# Patient Record
Sex: Female | Born: 1955
Health system: Southern US, Community
[De-identification: ages and names within clinical notes are randomized; demographics above are authoritative.]

## PROBLEM LIST (undated history)

## (undated) DIAGNOSIS — M810 Age-related osteoporosis without current pathological fracture: Secondary | ICD-10-CM

## (undated) DIAGNOSIS — M8580 Other specified disorders of bone density and structure, unspecified site: Secondary | ICD-10-CM

## (undated) DIAGNOSIS — R0600 Dyspnea, unspecified: Secondary | ICD-10-CM

## (undated) DIAGNOSIS — R519 Headache, unspecified: Secondary | ICD-10-CM

## (undated) DIAGNOSIS — F419 Anxiety disorder, unspecified: Secondary | ICD-10-CM

## (undated) DIAGNOSIS — R51 Headache: Secondary | ICD-10-CM

## (undated) DIAGNOSIS — M1990 Unspecified osteoarthritis, unspecified site: Secondary | ICD-10-CM

## (undated) DIAGNOSIS — R011 Cardiac murmur, unspecified: Secondary | ICD-10-CM

## (undated) DIAGNOSIS — I340 Nonrheumatic mitral (valve) insufficiency: Secondary | ICD-10-CM

## (undated) DIAGNOSIS — IMO0001 Reserved for inherently not codable concepts without codable children: Secondary | ICD-10-CM

## (undated) DIAGNOSIS — B029 Zoster without complications: Secondary | ICD-10-CM

## (undated) DIAGNOSIS — T7840XA Allergy, unspecified, initial encounter: Secondary | ICD-10-CM

## (undated) HISTORY — DX: Other specified disorders of bone density and structure, unspecified site: M85.80

## (undated) HISTORY — DX: Age-related osteoporosis without current pathological fracture: M81.0

## (undated) HISTORY — DX: Allergy, unspecified, initial encounter: T78.40XA

## (undated) HISTORY — PX: EYE SURGERY: SHX253

## (undated) HISTORY — PX: SPINE SURGERY: SHX786

## (undated) HISTORY — DX: Zoster without complications: B02.9

## (undated) HISTORY — DX: Nonrheumatic mitral (valve) insufficiency: I34.0

## (undated) HISTORY — PX: JOINT REPLACEMENT: SHX530

---

## 1999-05-31 ENCOUNTER — Other Ambulatory Visit: Admission: RE | Admit: 1999-05-31 | Discharge: 1999-05-31 | Payer: Self-pay | Admitting: *Deleted

## 1999-05-31 ENCOUNTER — Encounter (INDEPENDENT_AMBULATORY_CARE_PROVIDER_SITE_OTHER): Payer: Self-pay

## 1999-12-22 ENCOUNTER — Ambulatory Visit (HOSPITAL_COMMUNITY): Admission: RE | Admit: 1999-12-22 | Discharge: 1999-12-22 | Payer: Self-pay | Admitting: Internal Medicine

## 2000-07-03 ENCOUNTER — Other Ambulatory Visit: Admission: RE | Admit: 2000-07-03 | Discharge: 2000-07-03 | Payer: Self-pay | Admitting: *Deleted

## 2001-06-13 ENCOUNTER — Emergency Department (HOSPITAL_COMMUNITY): Admission: EM | Admit: 2001-06-13 | Discharge: 2001-06-14 | Payer: Self-pay | Admitting: Emergency Medicine

## 2001-06-13 ENCOUNTER — Encounter: Payer: Self-pay | Admitting: Emergency Medicine

## 2001-07-25 ENCOUNTER — Other Ambulatory Visit: Admission: RE | Admit: 2001-07-25 | Discharge: 2001-07-25 | Payer: Self-pay | Admitting: *Deleted

## 2002-08-07 ENCOUNTER — Encounter: Payer: Self-pay | Admitting: *Deleted

## 2002-08-07 ENCOUNTER — Encounter: Admission: RE | Admit: 2002-08-07 | Discharge: 2002-08-07 | Payer: Self-pay | Admitting: *Deleted

## 2002-09-18 ENCOUNTER — Other Ambulatory Visit: Admission: RE | Admit: 2002-09-18 | Discharge: 2002-09-18 | Payer: Self-pay | Admitting: *Deleted

## 2003-07-19 ENCOUNTER — Encounter: Admission: RE | Admit: 2003-07-19 | Discharge: 2003-07-19 | Payer: Self-pay | Admitting: Obstetrics and Gynecology

## 2003-11-10 ENCOUNTER — Other Ambulatory Visit: Admission: RE | Admit: 2003-11-10 | Discharge: 2003-11-10 | Payer: Self-pay | Admitting: Obstetrics and Gynecology

## 2004-12-11 ENCOUNTER — Ambulatory Visit: Payer: Self-pay | Admitting: Internal Medicine

## 2005-01-15 ENCOUNTER — Other Ambulatory Visit: Admission: RE | Admit: 2005-01-15 | Discharge: 2005-01-15 | Payer: Self-pay | Admitting: Obstetrics and Gynecology

## 2005-12-21 ENCOUNTER — Ambulatory Visit: Payer: Self-pay | Admitting: Internal Medicine

## 2006-12-11 DIAGNOSIS — J302 Other seasonal allergic rhinitis: Secondary | ICD-10-CM | POA: Insufficient documentation

## 2006-12-11 DIAGNOSIS — J4540 Moderate persistent asthma, uncomplicated: Secondary | ICD-10-CM | POA: Insufficient documentation

## 2006-12-11 DIAGNOSIS — J309 Allergic rhinitis, unspecified: Secondary | ICD-10-CM

## 2006-12-12 ENCOUNTER — Encounter: Payer: Self-pay | Admitting: Internal Medicine

## 2006-12-12 ENCOUNTER — Ambulatory Visit: Payer: Self-pay | Admitting: Internal Medicine

## 2006-12-17 ENCOUNTER — Encounter: Payer: Self-pay | Admitting: Internal Medicine

## 2006-12-17 ENCOUNTER — Ambulatory Visit: Payer: Self-pay

## 2006-12-17 DIAGNOSIS — I340 Nonrheumatic mitral (valve) insufficiency: Secondary | ICD-10-CM

## 2006-12-17 HISTORY — DX: Nonrheumatic mitral (valve) insufficiency: I34.0

## 2006-12-27 ENCOUNTER — Telehealth: Payer: Self-pay | Admitting: Internal Medicine

## 2007-02-09 LAB — CONVERTED CEMR LAB: Pap Smear: NORMAL

## 2007-05-06 ENCOUNTER — Telehealth (INDEPENDENT_AMBULATORY_CARE_PROVIDER_SITE_OTHER): Payer: Self-pay | Admitting: *Deleted

## 2007-07-14 ENCOUNTER — Ambulatory Visit: Payer: Self-pay | Admitting: Internal Medicine

## 2007-07-14 LAB — CONVERTED CEMR LAB
ALT: 18 units/L (ref 0–35)
AST: 22 units/L (ref 0–37)
Albumin: 3.9 g/dL (ref 3.5–5.2)
Alkaline Phosphatase: 58 units/L (ref 39–117)
BUN: 18 mg/dL (ref 6–23)
Basophils Absolute: 0 10*3/uL (ref 0.0–0.1)
Basophils Relative: 0.6 % (ref 0.0–1.0)
Bilirubin Urine: NEGATIVE
Bilirubin, Direct: 0.1 mg/dL (ref 0.0–0.3)
CO2: 27 meq/L (ref 19–32)
Calcium: 9.1 mg/dL (ref 8.4–10.5)
Chloride: 107 meq/L (ref 96–112)
Cholesterol: 172 mg/dL (ref 0–200)
Creatinine, Ser: 0.9 mg/dL (ref 0.4–1.2)
Eosinophils Absolute: 0.2 10*3/uL (ref 0.0–0.7)
Eosinophils Relative: 3.6 % (ref 0.0–5.0)
GFR calc Af Amer: 85 mL/min
GFR calc non Af Amer: 70 mL/min
Glucose, Bld: 83 mg/dL (ref 70–99)
Glucose, Urine, Semiquant: NEGATIVE
HCT: 39.2 % (ref 36.0–46.0)
HDL: 91.8 mg/dL (ref >39.0)
Hemoglobin: 13.1 g/dL (ref 12.0–15.0)
Ketones, urine, test strip: NEGATIVE
LDL Cholesterol: 71 mg/dL (ref 0–99)
Lymphocytes Relative: 40.8 % (ref 12.0–46.0)
MCHC: 33.4 g/dL (ref 30.0–36.0)
MCV: 79.7 fL (ref 78.0–100.0)
Monocytes Absolute: 0.5 10*3/uL (ref 0.1–1.0)
Monocytes Relative: 9.8 % (ref 3.0–12.0)
Neutro Abs: 2.3 10*3/uL (ref 1.4–7.7)
Neutrophils Relative %: 45.2 % (ref 43.0–77.0)
Nitrite: NEGATIVE
Platelets: 220 10*3/uL (ref 150–400)
Potassium: 4 meq/L (ref 3.5–5.1)
RBC: 4.92 M/uL (ref 3.87–5.11)
RDW: 12.8 % (ref 11.5–14.6)
Sodium: 142 meq/L (ref 135–145)
Specific Gravity, Urine: >=1.03
TSH: 1.07 microintl units/mL (ref 0.35–5.50)
Total Bilirubin: 0.7 mg/dL (ref 0.3–1.2)
Total CHOL/HDL Ratio: 1.9
Total Protein: 6.4 g/dL (ref 6.0–8.3)
Triglycerides: 45 mg/dL (ref 0–149)
Urobilinogen, UA: 0.2
VLDL: 9 mg/dL (ref 0–40)
WBC Urine, dipstick: NEGATIVE
WBC: 5.1 10*3/uL (ref 4.5–10.5)
pH: 5.5

## 2007-07-21 ENCOUNTER — Ambulatory Visit: Payer: Self-pay | Admitting: Internal Medicine

## 2007-08-18 ENCOUNTER — Ambulatory Visit: Payer: Self-pay | Admitting: Gastroenterology

## 2007-08-18 ENCOUNTER — Encounter (INDEPENDENT_AMBULATORY_CARE_PROVIDER_SITE_OTHER): Payer: Self-pay | Admitting: *Deleted

## 2007-09-01 ENCOUNTER — Ambulatory Visit: Payer: Self-pay | Admitting: Gastroenterology

## 2007-09-01 LAB — HM COLONOSCOPY

## 2007-10-16 ENCOUNTER — Ambulatory Visit: Payer: Self-pay | Admitting: Internal Medicine

## 2007-12-12 ENCOUNTER — Ambulatory Visit: Payer: Self-pay | Admitting: Internal Medicine

## 2008-09-15 ENCOUNTER — Encounter: Payer: Self-pay | Admitting: Internal Medicine

## 2008-12-30 ENCOUNTER — Ambulatory Visit: Payer: Self-pay | Admitting: Internal Medicine

## 2009-05-02 ENCOUNTER — Ambulatory Visit: Payer: Self-pay | Admitting: Family Medicine

## 2009-06-24 ENCOUNTER — Encounter: Payer: Self-pay | Admitting: Internal Medicine

## 2009-12-23 ENCOUNTER — Encounter: Payer: Self-pay | Admitting: Internal Medicine

## 2009-12-23 ENCOUNTER — Ambulatory Visit: Payer: Self-pay | Admitting: Internal Medicine

## 2009-12-26 ENCOUNTER — Encounter: Payer: Self-pay | Admitting: Internal Medicine

## 2009-12-27 ENCOUNTER — Telehealth (INDEPENDENT_AMBULATORY_CARE_PROVIDER_SITE_OTHER): Payer: Self-pay | Admitting: *Deleted

## 2009-12-27 LAB — CONVERTED CEMR LAB
Albumin: 4.2 g/dL (ref 3.5–5.2)
Total Protein: 7.1 g/dL (ref 6.0–8.3)

## 2010-01-08 DIAGNOSIS — B029 Zoster without complications: Secondary | ICD-10-CM

## 2010-01-08 HISTORY — DX: Zoster without complications: B02.9

## 2010-02-07 NOTE — Letter (Signed)
Summary: [facility]  [facility]   Imported By: Maryln Gottron 07/14/2009 13:21:48  _____________________________________________________________________  External Attachment:    Type:   Image     Comment:   External Document

## 2010-02-07 NOTE — Assessment & Plan Note (Signed)
Summary: st/njr   Vital Signs:  Patient profile:   55 year old female Weight:      144 pounds Temp:     98.2 degrees F oral BP sitting:   110 / 72  (left arm) Cuff size:   regular  Vitals Entered By: Kern Reap CMA Duncan Dull) (May 02, 2009 12:05 PM) CC: sore throat   CC:  sore throat.  History of Present Illness: Jessica Richards is a 55 year old, married female, nonsmoker, who comes in today with a 3-day history of a sore throat.  She has no fever, earache, cough, etc..  Her husband had a sore throat last week.  She is not been any contact with children who had strep  Allergies: No Known Drug Allergies  Past History:  Past medical, surgical, family and social histories (including risk factors) reviewed for relevance to current acute and chronic problems.  Past Medical History: Reviewed history from 12/11/2006 and no changes required. Allergic Rhinitis Asthma  Past Surgical History: Reviewed history from 07/21/2007 and no changes required. Denies surgical history  Family History: Reviewed history from 07/21/2007 and no changes required. mother-htn, smoker father- CAD (started in 82s) Family History of CAD Female 1st degree relative <50-father  Social History: Reviewed history from 12/12/2007 and no changes required. Occupation: Community education officer  Review of Systems      See HPI  Physical Exam  General:  Well-developed,well-nourished,in no acute distress; alert,appropriate and cooperative throughout examination Head:  Normocephalic and atraumatic without obvious abnormalities. No apparent alopecia or balding. Eyes:  No corneal or conjunctival inflammation noted. EOMI. Perrla. Funduscopic exam benign, without hemorrhages, exudates or papilledema. Vision grossly normal. Ears:  External ear exam shows no significant lesions or deformities.  Otoscopic examination reveals clear canals, tympanic membranes are intact bilaterally without bulging, retraction, inflammation or discharge.  Hearing is grossly normal bilaterally. Nose:  External nasal examination shows no deformity or inflammation. Nasal mucosa are pink and moist without lesions or exudates. Mouth:  Oral mucosa and oropharynx without lesions or exudates.  Teeth in good repair. Neck:  No deformities, masses, or tenderness noted.   Impression & Recommendations:  Problem # 1:  SORE THROAT (ICD-462) Assessment New  Orders: Rapid Strep (30865)  Complete Medication List: 1)  Theophylline Cr 200 Mg Tb12 (Theophylline) .... Take 1 by mouth three times a day 2)  Spiriva Handihaler 18 Mcg Caps (Tiotropium bromide monohydrate) .... Inhale 1 capsule once daily 3)  Proair Hfa 108 (90 Base) Mcg/act Aers (Albuterol sulfate) .... As needed 4)  Albuterol Sulfate (2.5 Mg/51ml) 0.083% Nebu (Albuterol sulfate) .Marland Kitchen.. 1 four times a day 5)  Prempro 0.625-2.5 Mg Tabs (Conj estrog-medroxyprogest ace) .... Take 1 by mouth once daily 6)  Serevent Diskus 50 Mcg/dose Aepb (Salmeterol xinafoate) .Marland Kitchen.. 1 puff twice daily 7)  Pulmicort Flexhaler 90 Mcg/act Aepb (Budesonide) .... 2 puffs and rinse two times a day 8)  Aerochamber Spacer  .... Use on your metered inhaler 9)  Hydromet 5-1.5 Mg/65ml Syrp (Hydrocodone-homatropine) .Marland Kitchen.. 1 or 2 tsps at bedtime prn  Patient Instructions: 1)  Tylenol, aspirin, or Motrin as needed for the sore throat pain.  You may take one or 2 teaspoons of Hydromet at bedtime as needed for severe pain Prescriptions: HYDROMET 5-1.5 MG/5ML SYRP (HYDROCODONE-HOMATROPINE) 1 or 2 tsps at bedtime prn  #4oz x 1   Entered and Authorized by:   Roderick Pee MD   Signed by:   Roderick Pee MD on 05/02/2009   Method used:   Print then  Give to Patient   RxID:   [id number]

## 2010-02-09 NOTE — Assessment & Plan Note (Signed)
Summary: rov 1 yr- pt requested this date ///kp   CC:  yearly follow up visit-asthma and allergies..  History of Present Illness:  12/12/07- Asthma, allergic rhinitis She felt b etter with Serevent/ pulmicort than with Symbicort. Does like Spiriva. Bad flu syndrome in October, treated by Dr Cato Mulligan. Stil some residual dry cough but not wheezing. denies reflux. has 2 outdoors cats. Wears mask with close contact, and very sensitive to strong odors.  December 30, 2008- Asthma, allergic rhinitis We switched her last year from serevent/ Pulmicort to Symbicort. She says with this change she has been having more mucus. We compared these to [facility]. Has had flu vax. Not running regularly at this time.  December 23, 2009-  Asthma, allergic rhinitis Nurse-CC: yearly follow up visit-asthma and allergies. Not running now and evaluated for bursitis in legs. Asthma has been worse this year. More chest tightness and dyspnea with wheeze, but not overt "attacks". No phlegm. Gets tach palpitation with increased use of her nebulizer. Denies reflux or nasal/ sinus. No need for prednisone or ER, but having difficulty holding notes in choir.     Asthma History    Initial Asthma Severity Rating:    Age range: 12+ years    Symptoms: >2 days/week; not daily    Nighttime Awakenings: 0-2/month    Interferes w/ normal activity: minor limitations    SABA use (not for EIB): >2 days/week but not >1X/day    Asthma Severity Assessment: Mild Persistent   Current Medications (verified): 1)  Theophylline Cr 200 Mg  Tb12 (Theophylline) .... Take 1 By Mouth Three Times A Day 2)  Spiriva Handihaler 18 Mcg  Caps (Tiotropium Bromide Monohydrate) .... Inhale 1 Capsule Once Daily 3)  Proair Hfa 108 (90 Base) Mcg/act Aers (Albuterol Sulfate) .... As Needed 4)  Albuterol Sulfate (2.5 Mg/31ml) 0.083% Nebu (Albuterol Sulfate) .Marland Kitchen.. 1 Four Times A Day 5)  Prempro 0.625-2.5 Mg  Tabs (Conj Estrog-Medroxyprogest Ace) .... Take 1 By  Mouth Once Daily 6)  Serevent Diskus 50 Mcg/dose Aepb (Salmeterol Xinafoate) .Marland Kitchen.. 1 Puff Twice Daily 7)  Pulmicort Flexhaler 90 Mcg/act Aepb (Budesonide) .... 2 Puffs and Rinse Two Times A Day 8)  Aerochamber Spacer .... Use On Your Metered Inhaler  Allergies (verified): No Known Drug Allergies  Past History:  Past Medical History: Last updated: 12/11/2006 Allergic Rhinitis Asthma  Past Surgical History: Last updated: 07/21/2007 Denies surgical history  Family History: Last updated: 07/21/2007 mother-htn, smoker father- CAD (started in 71s) Family History of CAD Female 1st degree relative <50-father  Social History: Last updated: 12/12/2007 Occupation: [employer]  Risk Factors: Alcohol Use: <1 (07/21/2007)  Risk Factors: Smoking Status: quit (12/11/2006)  Review of Systems       The patient complains of shortness of breath with activity and joint stiffness or pain.  The patient denies shortness of breath at rest, productive cough, non-productive cough, coughing up blood, chest pain, irregular heartbeats, acid heartburn, indigestion, loss of appetite, weight change, abdominal pain, difficulty swallowing, sore throat, tooth/dental problems, headaches, nasal congestion/difficulty breathing through nose, sneezing, itching, anxiety, hand/feet swelling, rash, change in color of mucus, and fever.    Vital Signs:  Patient profile:   55 year old female Height:      62.5 inches Weight:      144.25 pounds BMI:     26.06 O2 Sat:      96 % on Room air Pulse rate:   58 / minute BP sitting:   116 / 78  (left arm)  Cuff size:   regular  Vitals Entered By: Reynaldo Minium CMA (December 23, 2009 9:14 AM)  O2 Flow:  Room air  Physical Exam  Additional Exam:  General: A/Ox3; pleasant and cooperative, NAD, trim SKIN: no rash, lesions NODES: no lymphadenopathy HEENT: [place]/AT, EOM- WNL, Conjuctivae- clear, PERRLA, TM-WNL, Nose- clear, Throat- clear and wnl NECK: Supple w/ fair ROM, JVD-  none, normal carotid impulses w/o bruits Thyroid- CHEST: bilateral expiratory wheeze, umnlabored HEART: RRR, no m/g/r heard ABDOMEN: Soft and nl;  ZOX:WRUE, nl pulses, no edema  NEURO: Grossly intact to observation      Impression & Recommendations:  Problem # 1:  ASTHMA (ICD-493.90) Control is not good enough. We will check spirometry today. I introduced and will try Zyflo CR, hoping to find additional antiinflammatory help since she is having rapid heart rates and won't  tolerate more stimulant. She is very compliant. She had failed Singulair in the past.  We will check theophylline level and LFT in advance of Zyflo. We are reducing her theophylline dose to accomodate Zyflo CR.  Problem # 2:  ALLERGIC RHINITIS (ICD-477.9) She is not describing exacerbation of rhinitis.  Medications Added to Medication List This Visit: 1)  Theophylline Cr 200 Mg Tb12 (Theophylline) .... Take 1 by mouth two  times a day 2)  Zyflo Cr 600 Mg Xr12h-tab (Zileuton) .... 2 two times a day after meals  Other Orders: Est. Patient Level IV (45409) T-Theophylline Level (81191-47829) TLB-Hepatic/Liver Function Pnl (80076-HEPATIC)  Patient Instructions: 1)  Please schedule a follow-up appointment in 1 month. 2)  Sample / script given for Zyflo CR 600 mg x 2, two times a day after meals 3)  Lab for LFT check 4)  Office spirometry today.  Prescriptions: ZYFLO CR 600 MG XR12H-TAB (ZILEUTON) 2 two times a day after meals  #120 x prn   Entered and Authorized by:   Waymon Budge MD   Signed by:   Waymon Budge MD on 12/23/2009   Method used:   Print then Give to Patient   RxID:   [id number]

## 2010-02-09 NOTE — Progress Notes (Signed)
Summary: returning call  Phone Note Call from Patient Call back at Work Phone [phone]   Caller: Patient Call For: YOUNG Reason for Call: Talk to Nurse Summary of Call: Returning call from yesterday concerning lab work. Initial call taken by: Lehman Prom,  December 27, 2009 10:13 AM  Follow-up for Phone Call        called spoke with patient.  advised of lab results.  pt did have one question about the theophylilne level:  she states that she normally only takes the theophylline two times a day when her asthma does well, but x63months has been taking it three times a day as prescribed.  so she wonders if perhaps that it why the level "looks" elevated whereas in the past she's had the level tested while taking it two times a day.  would also like to know if she can take the theophyllin 1  every morning and 1 every evening or if it's better to take both at the same time?  please advise, thanks! Follow-up by: Boone Master CNA/MA,  December 27, 2009 11:43 AM  Additional Follow-up for Phone Call Additional follow up Details #1::        The blood level would be acceptable if she were only taking theophylline. My concern is that we are asking her to add Zyflo, which would raise it further.  So- I would suggest she take theophylline once in AM and once in PM, total of 2 doses daily, while on Zyflo.  Additional Follow-up by: Waymon Budge MD,  December 27, 2009 1:20 PM    Additional Follow-up for Phone Call Additional follow up Details #2::    Spoke with pt and notified of the above recs per CDY.  Pt verbalized understanding. Follow-up by: Vernie Murders,  December 27, 2009 1:57 PM

## 2010-05-23 NOTE — Assessment & Plan Note (Signed)
[facility]                             PULMONARY OFFICE NOTE   [name], [name]                       MRN:          [id number]  DATE:12/12/2006                            DOB:          30-Apr-1955    PROBLEM LIST:  1. Asthma.  2. Allergic rhinitis.   HISTORY:  One-year followup.  With fall weather she began needing to use  her albuterol rescue inhaler a little more, and she still can tell if  she misses a dose of theophylline.  We discussed theophylline side  effects.  Her theophylline level last year was 6.2 on her current dose  of 200 mg t.i.d., and we chose to continue.  Has had flu shots.  Does  not like HFA inhaler as much as the old form.  We discussed Symbicort as  an alternative to her use of Serevent disks plus Pulmicort Flexhaler,  and she wants to try it.  She still uses her nebulizer with Intal and  albuterol occasionally, but not often.  Still runs.   MEDICATIONS:  1. Theophylline 200 mg t.i.d.  2. Spiriva.  3. Serevent Diskus 1 b.i.d.  4. Pulmicort Flexhaler 1 b.i.d.  5. Prempro home nebulizer with Intal and albuterol 0.083%.  6. Symbicort 160/4.5 is added today.  7. Albuterol/Proventil HFA 2 puffs q.i.d. p.r.n.   Drug intolerant to ACCOLATE which caused urticaria.   OBJECTIVE:  BP 122/78, pulse 55, room air saturation 99%, weight 134  pounds.  She is trim, alert looks quite comfortable.  Eyes, nose, and throat are very clear.  Voice quality is normal, no  stridor.  CHEST:  Clear.  No wheeze, no cough.  Heart sounds are regular without murmur or gallop.  There is no  adenopathy, no cyanosis or clubbing.   IMPRESSION:  1. Asthma with a seasonal allergic component, adequately controlled      but still requiring quite a bit of medication.  2. Allergic rhinitis, currently not active.  3. Incidental concern raised about history of heart murmur which I did      not hear today.  She has noted some orthostasis if she stands    quickly, and occasional palpitation if she lies on her left side.      I discussed orthostasis as separate from the murmur, but we agreed      perhaps the best way to evaluate these problems would be to get an      echocardiogram which is being ordered.  4. We are scheduling return in 1 year, earlier p.r.n.  She is given      sample and prescription Symbicort 160/4.5 two puffs b.i.d. to try      as an alternative to the Serevent with Pulmicort.     Jessica D. Maple Hudson, MD, Tonny Bollman, FACP  Electronically Signed    CDY/MedQ  DD: 12/12/2006  DT: 12/13/2006  Job #: [id number]   cc:   Valetta Mole. Swords, MD

## 2010-05-26 NOTE — Assessment & Plan Note (Signed)
[facility]                             PULMONARY OFFICE NOTE   Jessica Richards, Jessica Richards                       MRN:          [id number]  DATE:12/21/2005                            DOB:          May 18, 1955    PRIMARY PHYSICIAN:  Dr. Birdie Sons.   PROBLEMS:  1. Asthma.  2. Allergic rhinitis.   HISTORY:  1 year followup, doing very well. She still runs and bikes  long distances with her husband, recently finishing a 52 mile bike trek,  during which she was pleased that she did not need to use her metered  inhaler. She has not needed her nebulizer machine in a long time. If she  skips a Theophylline dose, she says she can feel it, continuing  Theophylline at 200 mg t.i.d. This gave a blood level of 6.2 (10-20) on  12/11/2004. We discussed the medication again. She credits the Spiriva  for the best improvement, and asked we refill routine medications.   MEDICATIONS:  1. Theophylline 200 mg t.i.d.  2. Spiriva 1 daily.  3. Serevent discus 1 puff daily.  4. Pulmicort Turbuhaler, which we will change to Pulmicort Flexhaler 1      puff b.i.d.  5. Albuterol rescue inhaler, used not more than 3 times a week,      usually less.   OBJECTIVE:  Weight 131 pounds, blood pressure 118/68, pulse regular 47,  room air saturation 99%. Trim, alert, looks comfortable. Lung fields are  very clear, with good airflow. Heart sounds are regular without murmur.  There is no edema. No tremor.   IMPRESSION:  Stable, well controlled asthma. We discussed medications,  in particular theophylline.   PLAN:  She will continue Spiriva, theophylline and her Serevent discus,  replacing Pulmicort with Pulmicort Flexhaler, 1 puff b.i.d. Return 1  year, earlier p.r.n.     Clinton D. Maple Hudson, MD, Tonny Bollman, FACP  Electronically Signed    CDY/MedQ  DD: 12/21/2005  DT: 12/22/2005  Job #: [id number]

## 2010-11-23 ENCOUNTER — Other Ambulatory Visit: Payer: Self-pay | Admitting: Internal Medicine

## 2010-11-29 ENCOUNTER — Telehealth: Payer: Self-pay | Admitting: Internal Medicine

## 2010-11-29 NOTE — Telephone Encounter (Signed)
[email or web address]  

## 2010-11-29 NOTE — Telephone Encounter (Signed)
I spoke with pt and she states she needs a refill on her theophylline. Pt is aware wills end rx but needs to show up for OV w/ CDY 12/22/2010 for further refills. Pt verbalized understanding and is aware rx was sent

## 2010-12-21 ENCOUNTER — Encounter: Payer: Self-pay | Admitting: Internal Medicine

## 2010-12-22 ENCOUNTER — Ambulatory Visit (INDEPENDENT_AMBULATORY_CARE_PROVIDER_SITE_OTHER): Payer: Self-pay | Admitting: Internal Medicine

## 2010-12-22 ENCOUNTER — Other Ambulatory Visit: Payer: BC Managed Care – PPO

## 2010-12-22 ENCOUNTER — Encounter: Payer: Self-pay | Admitting: Internal Medicine

## 2010-12-22 ENCOUNTER — Ambulatory Visit (INDEPENDENT_AMBULATORY_CARE_PROVIDER_SITE_OTHER)
Admission: RE | Admit: 2010-12-22 | Discharge: 2010-12-22 | Disposition: A | Discharge status: Home / Self Care | Payer: BC Managed Care – PPO | Source: Ambulatory Visit | Attending: Internal Medicine | Admitting: Internal Medicine

## 2010-12-22 DIAGNOSIS — J45909 Unspecified asthma, uncomplicated: Secondary | ICD-10-CM

## 2010-12-22 DIAGNOSIS — J309 Allergic rhinitis, unspecified: Secondary | ICD-10-CM

## 2010-12-22 MED ORDER — ALBUTEROL SULFATE (2.5 MG/3ML) 0.083% IN NEBU: 2.5000 mg | INHALATION_SOLUTION | Freq: Four times a day (QID) | RESPIRATORY_TRACT | Status: DC

## 2010-12-22 NOTE — Patient Instructions (Addendum)
Sample Dulera 200-5    2 puffs and rinse mouth well, twice daily- do this instead of Serevent and Pulmicort. If you like it better, call for script.  Order- CXR- dx Asthma             Theophylline level  Please call as needed

## 2010-12-22 NOTE — Progress Notes (Signed)
12/22/10- 55yoF former smoker followed for allergic rhinitis, asthma LOV- 12/23/2009 Has had flu vaccine. She has noticed more wheeze and more need for her rescue inhaler since spring of this year. Cough and chest rattle but usually not productive. Often wakes tight in her chest. She is no longer exercising regularly despite some encouragement from her husband. Zyflo was too expensive. She has increased her theophylline to 400 mg twice daily.  ROS-see HPI Constitutional:   No-   weight loss, night sweats, fevers, chills, fatigue, lassitude. HEENT:   No-  headaches, difficulty swallowing, tooth/dental problems, sore throat,       No-  sneezing, itching, ear ache, nasal congestion, post nasal drip,  CV:  No-   chest pain, orthopnea, PND, swelling in lower extremities, anasarca,                                  dizziness, palpitations Resp: + shortness of breath with exertion or at rest.              No-   productive cough,  + non-productive cough,  No- coughing up of blood.              No-   change in color of mucus.  No- wheezing.   Skin: No-   rash or lesions. GI:  No-   heartburn, indigestion, abdominal pain, nausea, vomiting, diarrhea,                 change in bowel habits, loss of appetite GU:  MS:  No-   joint pain or swelling.  No- decreased range of motion.  No- back pain. Neuro-     nothing unusual Psych:  No- change in mood or affect. No depression or anxiety.  No memory loss.  OBJ General- Alert, Oriented, Affect-appropriate, Distress- none acute, trim Skin- rash-none, lesions- none, excoriation- none Lymphadenopathy- none Head- atraumatic            Eyes- Gross vision intact, PERRLA, conjunctivae clear secretions            Ears- Hearing, canals-normal            Nose- Clear, no-Septal dev, mucus, polyps, erosion, perforation             Throat- Mallampati II , mucosa clear , drainage- none, tonsils- atrophic Neck- flexible , trachea midline, no stridor , thyroid nl, carotid  no bruit Chest - symmetrical excursion , unlabored           Heart/CV- RRR , no murmur , no gallop  , no rub, nl s1 s2                           - JVD- none , edema- none, stasis changes- none, varices- none           Lung- Unlabored, wheeze with forced expiration, cough- none , dullness-none, rub- none           Chest wall-  Abd- tender-no, distended-no, bowel sounds-present, HSM- no Br/ Gen/ Rectal- Not done, not indicated Extrem- cyanosis- none, clubbing, none, atrophy- none, strength- nl Neuro- grossly intact to observation

## 2010-12-25 NOTE — Assessment & Plan Note (Signed)
Her pattern is sounding more like a fixed obstructive airways disease, likely related to her previous smoking history, rather than intermittent asthma. Plan-theophylline level, chest x-ray, try Dulera 200 instead of Serevent plus Pulmicort

## 2010-12-25 NOTE — Assessment & Plan Note (Signed)
Controlled without intervention needed.

## 2011-01-03 ENCOUNTER — Emergency Department (INDEPENDENT_AMBULATORY_CARE_PROVIDER_SITE_OTHER): Payer: BC Managed Care – PPO

## 2011-01-03 ENCOUNTER — Telehealth: Payer: Self-pay | Admitting: Internal Medicine

## 2011-01-03 ENCOUNTER — Emergency Department (HOSPITAL_BASED_OUTPATIENT_CLINIC_OR_DEPARTMENT_OTHER)
Admission: EM | Admit: 2011-01-03 | Discharge: 2011-01-03 | Disposition: A | Discharge status: Home / Self Care | Payer: BC Managed Care – PPO | Attending: Emergency Medicine | Admitting: Emergency Medicine

## 2011-01-03 ENCOUNTER — Encounter (HOSPITAL_BASED_OUTPATIENT_CLINIC_OR_DEPARTMENT_OTHER): Payer: Self-pay | Admitting: *Deleted

## 2011-01-03 DIAGNOSIS — IMO0001 Reserved for inherently not codable concepts without codable children: Secondary | ICD-10-CM | POA: Insufficient documentation

## 2011-01-03 DIAGNOSIS — J069 Acute upper respiratory infection, unspecified: Secondary | ICD-10-CM | POA: Insufficient documentation

## 2011-01-03 DIAGNOSIS — R0602 Shortness of breath: Secondary | ICD-10-CM | POA: Insufficient documentation

## 2011-01-03 DIAGNOSIS — Z79899 Other long term (current) drug therapy: Secondary | ICD-10-CM | POA: Insufficient documentation

## 2011-01-03 DIAGNOSIS — R059 Cough, unspecified: Secondary | ICD-10-CM

## 2011-01-03 DIAGNOSIS — R05 Cough: Secondary | ICD-10-CM

## 2011-01-03 DIAGNOSIS — R0989 Other specified symptoms and signs involving the circulatory and respiratory systems: Secondary | ICD-10-CM

## 2011-01-03 DIAGNOSIS — R0600 Dyspnea, unspecified: Secondary | ICD-10-CM

## 2011-01-03 DIAGNOSIS — J4489 Other specified chronic obstructive pulmonary disease: Secondary | ICD-10-CM | POA: Insufficient documentation

## 2011-01-03 DIAGNOSIS — J449 Chronic obstructive pulmonary disease, unspecified: Secondary | ICD-10-CM | POA: Insufficient documentation

## 2011-01-03 HISTORY — DX: Reserved for inherently not codable concepts without codable children: IMO0001

## 2011-01-03 MED ORDER — AZITHROMYCIN 250 MG PO TABS: ORAL_TABLET | ORAL | Status: AC

## 2011-01-03 MED ORDER — ACETAMINOPHEN 500 MG PO TABS
1000.0000 mg | ORAL_TABLET | Freq: Once | ORAL | Status: AC
  Administered 2011-01-03: 1000 mg via ORAL
  Filled 2011-01-03: qty 2

## 2011-01-03 NOTE — Telephone Encounter (Signed)
Pt requesting to be contacted at [phone].  Jessica Richards

## 2011-01-03 NOTE — Telephone Encounter (Signed)
Please call back at different number [phone] [employer]

## 2011-01-03 NOTE — ED Provider Notes (Signed)
History     CSN: [id number]  Arrival date & time 01/03/11  1452   First MD Initiated Contact with Patient 01/03/11 1726     HPI Patient reports for 3 days has had persistent symptoms of cough, and chills, myalgias, rhinorrhea. Reports yesterday cough significantly worsened despite using albuterol nebulizer home treatments. Patient was unaware she had a fever until she arrived here in the ED with 103.2 temperature. Denies chest pain, shortness of breath, sore throat, abdominal pain, nausea, vomiting, headache, neck pain. Patient is a 55 y.o. female presenting with cough. The history is provided by the patient.  Cough This is a new problem. The current episode started more than 2 days ago. The problem occurs constantly. The cough is non-productive. The maximum temperature recorded prior to her arrival was 103 to 104 F. Associated symptoms include chills and myalgias. Pertinent negatives include no chest pain, no sore throat and no shortness of breath. Treatments tried: Albuterol nebulizer. The treatment provided mild relief. She is not a smoker. Her past medical history is significant for asthma.    Past Medical History  Diagnosis Date  . Allergic rhinitis   . Asthma   . COPD bronchitis     History reviewed. No pertinent past surgical history.  Family History  Problem Relation Age of Onset  . Hypertension Mother     smoker  . Coronary artery disease Father     started in 9s  . Coronary artery disease      female 1 st degree relative < 50-father    History  Substance Use Topics  . Smoking status: Never Smoker   . Smokeless tobacco: Never Used  . Alcohol Use: Not on file    OB History    Grav Para Term Preterm Abortions TAB SAB Ect Mult Living                  Review of Systems  Constitutional: Positive for fever and chills.  HENT: Negative for sore throat, trouble swallowing, neck pain, neck stiffness and sinus pressure.   Respiratory: Positive for cough. Negative for  shortness of breath.   Cardiovascular: Negative for chest pain.  Gastrointestinal: Negative for nausea, vomiting and abdominal pain.  Musculoskeletal: Positive for myalgias.  All other systems reviewed and are negative.    Allergies  Review of patient's allergies indicates no known allergies.  Home Medications   Current Outpatient Rx  Name Route Sig Dispense Refill  . ALBUTEROL SULFATE HFA 108 (90 BASE) MCG/ACT IN AERS Inhalation Inhale 2 puffs into the lungs 4 (four) times daily as needed.      . ALBUTEROL SULFATE (2.5 MG/3ML) 0.083% IN NEBU Nebulization Take 3 mLs (2.5 mg total) by nebulization 4 (four) times daily. 75 mL prn  . CALCIUM CARBONATE-VITAMIN D 600-400 MG-UNIT PO CHEW Oral Chew 2 tablets by mouth daily.      Marland Kitchen CONJ ESTROG-MEDROXYPROGEST ACE 0.625-2.5 MG PO TABS Oral Take 1 tablet by mouth daily.      . GUAIFENESIN ER 600 MG PO TB12 Oral Take 600 mg by mouth 2 (two) times daily.      . MOMETASONE FURO-FORMOTEROL FUM 100-5 MCG/ACT IN AERO Inhalation Inhale 2 puffs into the lungs 2 (two) times daily.      . ADULT MULTIVITAMIN W/MINERALS CH Oral Take 2 tablets by mouth daily.      Marland Kitchen PSEUDOEPHEDRINE HCL 30 MG PO TABS Oral Take 30 mg by mouth every 4 (four) hours as needed. For congestion     .  AEROCHAMBER MV MISC Other by Other route. Use as instructed     . THEOPHYLLINE ER 200 MG PO TB12 Oral Take 200-400 mg by mouth 2 (two) times daily. Take 2 tabs in the morning and 1 tab in the evening    . TIOTROPIUM BROMIDE MONOHYDRATE 18 MCG IN CAPS Inhalation Place 18 mcg into inhaler and inhale daily.        BP 110/58  Pulse 79  Temp(Src) 100.3 F (37.9 C) (Oral)  Resp 16  Ht 5\' 2"  (1.575 m)  Wt 153 lb (69.4 kg)  BMI 27.98 kg/m2  SpO2 96%  Physical Exam  Vitals reviewed. Constitutional: She is oriented to person, place, and time. Vital signs are normal. She appears well-developed and well-nourished.  HENT:  Head: Normocephalic and atraumatic.  Right Ear: External ear  normal.  Left Ear: External ear normal.  Nose: Nose normal.  Mouth/Throat: No oropharyngeal exudate.  Eyes: Conjunctivae and EOM are normal. Pupils are equal, round, and reactive to light.  Neck: Normal range of motion. Neck supple.  Cardiovascular: Normal rate, regular rhythm and normal heart sounds.  Exam reveals no friction rub.   No murmur heard. Pulmonary/Chest: Effort normal. Respiratory distress: Lower lung fields. She has wheezes. She has no rhonchi. She has no rales. She exhibits no tenderness.  Abdominal: Bowel sounds are normal. She exhibits no distension. There is no tenderness. There is no rebound and no guarding.  Musculoskeletal: Normal range of motion.  Neurological: She is alert and oriented to person, place, and time. Coordination normal.  Skin: Skin is warm and dry. No rash noted. No erythema. No pallor.    ED Course  Procedures   Dg Chest 2 View  01/03/2011  *RADIOLOGY REPORT*  Clinical Data: Cough and congestion  CHEST - 2 VIEW  Comparison: December 22, 2010  Findings: The cardiac silhouette, mediastinum, pulmonary vasculature are within normal limits.  Both lungs remain clear. The osseous structures are unremarkable.  IMPRESSION: Stable chest x-ray with no evidence of acute cardiac or pulmonary process.  Original Report Authenticated By: Brandon Melnick, M.D.    MDM          Thomasene Lot, PA 01/03/11 1752

## 2011-01-03 NOTE — ED Notes (Signed)
Pt c/o SOB since 12/14 Seen by PMD dx bronchitis , states not better

## 2011-01-03 NOTE — Telephone Encounter (Signed)
I spoke with pt and she states she is not feeling any better from when she last saw cdy. Pt has an apt to see cdy on Friday 01/05/11 at 9:45. Pt aware to seek emergency care if she worsens before then. Pt voiced her understanding

## 2011-01-03 NOTE — Telephone Encounter (Signed)
Pt is aware of her results. Pt voiced her understanding and had no questions.

## 2011-01-04 NOTE — ED Provider Notes (Signed)
Medical screening examination/treatment/procedure(s) were performed by non-physician practitioner and as supervising physician I was immediately available for consultation/collaboration.  Doug Sou, MD 01/04/11 [phone]

## 2011-01-05 ENCOUNTER — Ambulatory Visit: Payer: BC Managed Care – PPO | Admitting: Internal Medicine

## 2011-01-16 ENCOUNTER — Other Ambulatory Visit: Payer: Self-pay | Admitting: Internal Medicine

## 2011-01-22 ENCOUNTER — Ambulatory Visit (INDEPENDENT_AMBULATORY_CARE_PROVIDER_SITE_OTHER): Payer: BC Managed Care – PPO | Admitting: Internal Medicine

## 2011-01-22 DIAGNOSIS — R0600 Dyspnea, unspecified: Secondary | ICD-10-CM

## 2011-01-22 DIAGNOSIS — R0989 Other specified symptoms and signs involving the circulatory and respiratory systems: Secondary | ICD-10-CM

## 2011-01-22 DIAGNOSIS — R0609 Other forms of dyspnea: Secondary | ICD-10-CM

## 2011-01-22 LAB — PULMONARY FUNCTION TEST

## 2011-01-22 NOTE — Progress Notes (Signed)
PFT done today. 

## 2011-01-24 ENCOUNTER — Telehealth: Payer: Self-pay | Admitting: Internal Medicine

## 2011-01-24 MED ORDER — MOMETASONE FURO-FORMOTEROL FUM 200-5 MCG/ACT IN AERO: 2.0000 | INHALATION_SPRAY | Freq: Two times a day (BID) | RESPIRATORY_TRACT | Status: DC

## 2011-01-24 NOTE — Telephone Encounter (Signed)
Per CY's lat OV notes okay to send Rx if works well for patient-pt aware that we have sent the RX to [facility] as she requested.

## 2011-02-26 ENCOUNTER — Other Ambulatory Visit: Payer: Self-pay | Admitting: Internal Medicine

## 2011-02-27 ENCOUNTER — Other Ambulatory Visit: Payer: Self-pay | Admitting: Internal Medicine

## 2011-04-09 NOTE — Progress Notes (Signed)
Documentation for 6 minute walk test 

## 2011-05-05 ENCOUNTER — Other Ambulatory Visit: Payer: Self-pay | Admitting: Internal Medicine

## 2011-10-10 ENCOUNTER — Other Ambulatory Visit: Payer: Self-pay | Admitting: Obstetrics and Gynecology

## 2011-12-14 ENCOUNTER — Telehealth: Payer: Self-pay | Admitting: Internal Medicine

## 2011-12-14 MED ORDER — TIOTROPIUM BROMIDE MONOHYDRATE 18 MCG IN CAPS: 1.0000 | ORAL_CAPSULE | Freq: Every day | RESPIRATORY_TRACT | Status: DC

## 2011-12-14 MED ORDER — THEOPHYLLINE ER 200 MG PO TB12: ORAL_TABLET | ORAL | Status: DC

## 2011-12-14 NOTE — Telephone Encounter (Signed)
Pt notified that refills for Theophylline and Spiriva were sent to [facility]

## 2011-12-24 ENCOUNTER — Ambulatory Visit: Payer: BC Managed Care – PPO | Admitting: Internal Medicine

## 2011-12-31 ENCOUNTER — Encounter: Payer: Self-pay | Admitting: Internal Medicine

## 2011-12-31 ENCOUNTER — Ambulatory Visit (INDEPENDENT_AMBULATORY_CARE_PROVIDER_SITE_OTHER): Payer: BC Managed Care – PPO | Admitting: Internal Medicine

## 2011-12-31 VITALS — BP 100/66 | HR 53 | Ht 62.5 in | Wt 146.6 lb

## 2011-12-31 DIAGNOSIS — J209 Acute bronchitis, unspecified: Secondary | ICD-10-CM

## 2011-12-31 DIAGNOSIS — J45909 Unspecified asthma, uncomplicated: Secondary | ICD-10-CM

## 2011-12-31 MED ORDER — MOMETASONE FURO-FORMOTEROL FUM 100-5 MCG/ACT IN AERO: 2.0000 | INHALATION_SPRAY | Freq: Two times a day (BID) | RESPIRATORY_TRACT | Status: DC

## 2011-12-31 NOTE — Progress Notes (Signed)
12/22/10- 55yoF former smoker followed for allergic rhinitis, asthma LOV- 12/23/2009 Has had flu vaccine. She has noticed more wheeze and more need for her rescue inhaler since spring of this year. Cough and chest rattle but usually not productive. Often wakes tight in her chest. She is no longer exercising regularly despite some encouragement from her husband. Zyflo was too expensive. She has increased her theophylline to 400 mg twice daily.  12/31/11- 56yoF former smoker followed for allergic rhinitis, asthma FOLLOWS FOR: review PFT and with patient; denies any flare ups at this time. Been a good year. Less aware of dyspnea on exertion. She questions if that problem was related to cat exposure. The cat is now gone. She is not needing Dulera or her nebulizer. Her rescue inhaler about 2 times daily she continues to feel theophylline as necessary, noticing chest tightness and wheeze if she misses a dose. Has a cataract which she relates to previous steroid exposure-discussed. CXR 01/03/11-  IMPRESSION:  Stable chest x-ray with no evidence of acute cardiac or pulmonary  process.  Original Report Authenticated By: Brandon Melnick, M.D.  01/22/11-99%, 99%, 97%, 456 meters Not limited by oxygenation -CY PFT 01/22/2011-mild obstructive airways disease a slight response to bronchodilator, air trapping, normal diffusion. FEV1 2.18/95%, FEV1/FVC 0.65, FEF 25-75% 1.19/44%. RV 120%, DLCO 91%.  ROS-see HPI Constitutional:   No-   weight loss, night sweats, fevers, chills, fatigue, lassitude. HEENT:   No-  headaches, difficulty swallowing, tooth/dental problems, sore throat,       No-  sneezing, itching, ear ache, nasal congestion, post nasal drip,  CV:  No-   chest pain, orthopnea, PND, swelling in lower extremities, anasarca, dizziness, palpitations Resp: + Minimal shortness of breath with exertion or at rest.              No-   productive cough,  + non-productive cough,  No- coughing up of blood.               No-   change in color of mucus.  No- wheezing.   Skin: No-   rash or lesions. GI:  No-   heartburn, indigestion, abdominal pain, nausea, vomiting,  GU:  MS:  No-   joint pain or swelling.  Neuro-     nothing unusual Psych:  No- change in mood or affect. No depression or anxiety.  No memory loss.  OBJ General- Alert, Oriented, Affect-appropriate, Distress- none acute, trim Skin- rash-none, lesions- none, excoriation- none Lymphadenopathy- none Head- atraumatic            Eyes- Gross vision intact, PERRLA, conjunctivae clear secretions            Ears- Hearing, canals-normal            Nose- Clear, no-Septal dev, mucus, polyps, erosion, perforation             Throat- Mallampati II , mucosa clear , drainage- none, tonsils- atrophic Neck- flexible , trachea midline, no stridor , thyroid nl, carotid no bruit Chest - symmetrical excursion , unlabored           Heart/CV- RRR , no murmur , no gallop  , no rub, nl s1 s2                           - JVD- none , edema- none, stasis changes- none, varices- none           Lung- Unlabored, wheeze  with forced expiration, cough- none , dullness-none, rub- none           Chest wall-  Abd-  Br/ Gen/ Rectal- Not done, not indicated Extrem- cyanosis- none, clubbing, none, atrophy- none, strength- nl Neuro- grossly intact to observation

## 2011-12-31 NOTE — Patient Instructions (Addendum)
Try changing Dulera from 200 to 100    2 puffs and rinse, twice daily   Sample and script  We will try pulling the old paper chart for old PFT records

## 2012-01-11 ENCOUNTER — Encounter: Payer: Self-pay | Admitting: Internal Medicine

## 2012-01-11 NOTE — Assessment & Plan Note (Signed)
Minimal asthma /obstructive airways disease. Symptomatically improved from last year. We will try to find old chart alert function tests for comparison. Plan-change from Dulera 200 to [facility] 100

## 2012-02-16 ENCOUNTER — Other Ambulatory Visit: Payer: Self-pay | Admitting: Internal Medicine

## 2012-04-15 ENCOUNTER — Other Ambulatory Visit: Payer: Self-pay | Admitting: Internal Medicine

## 2012-09-03 LAB — LIPID PANEL
Cholesterol: 177 mg/dL (ref 0–200)
HDL: 98 mg/dL — AB (ref 35–70)
LDL CALC: 65 mg/dL

## 2012-09-03 LAB — BASIC METABOLIC PANEL: GLUCOSE: 88 mg/dL

## 2012-11-05 ENCOUNTER — Other Ambulatory Visit: Payer: Self-pay | Admitting: Obstetrics and Gynecology

## 2012-11-05 LAB — HM MAMMOGRAPHY

## 2012-11-05 LAB — HM PAP SMEAR

## 2012-11-19 LAB — HM DEXA SCAN

## 2012-12-16 ENCOUNTER — Other Ambulatory Visit: Payer: Self-pay | Admitting: Internal Medicine

## 2012-12-24 ENCOUNTER — Ambulatory Visit (INDEPENDENT_AMBULATORY_CARE_PROVIDER_SITE_OTHER): Payer: BC Managed Care – PPO | Admitting: Internal Medicine

## 2012-12-24 ENCOUNTER — Ambulatory Visit (INDEPENDENT_AMBULATORY_CARE_PROVIDER_SITE_OTHER)
Admission: RE | Admit: 2012-12-24 | Discharge: 2012-12-24 | Disposition: A | Discharge status: Home / Self Care | Payer: BC Managed Care – PPO | Source: Ambulatory Visit | Attending: Internal Medicine | Admitting: Internal Medicine

## 2012-12-24 ENCOUNTER — Encounter: Payer: Self-pay | Admitting: Internal Medicine

## 2012-12-24 ENCOUNTER — Encounter (INDEPENDENT_AMBULATORY_CARE_PROVIDER_SITE_OTHER): Payer: Self-pay

## 2012-12-24 ENCOUNTER — Other Ambulatory Visit: Payer: BC Managed Care – PPO

## 2012-12-24 VITALS — BP 112/72 | HR 60 | Ht 62.5 in | Wt 143.0 lb

## 2012-12-24 DIAGNOSIS — J45909 Unspecified asthma, uncomplicated: Secondary | ICD-10-CM

## 2012-12-24 DIAGNOSIS — J449 Chronic obstructive pulmonary disease, unspecified: Secondary | ICD-10-CM

## 2012-12-24 DIAGNOSIS — J209 Acute bronchitis, unspecified: Secondary | ICD-10-CM

## 2012-12-24 NOTE — Patient Instructions (Signed)
Order- CXR       Dx COPD              Lab- [facility]AT assay   [facility]  - would like Dr Felicity Coyer if available

## 2012-12-24 NOTE — Progress Notes (Signed)
12/22/10- 55yoF former smoker followed for allergic rhinitis, asthma LOV- 12/23/2009 Has had flu vaccine. She has noticed more wheeze and more need for her rescue inhaler since spring of this year. Cough and chest rattle but usually not productive. Often wakes tight in her chest. She is no longer exercising regularly despite some encouragement from her husband. Zyflo was too expensive. She has increased her theophylline to 400 mg twice daily.  12/31/11- 56yoF former smoker followed for allergic rhinitis, asthma FOLLOWS FOR: review PFT and with patient; denies any flare ups at this time. Been a good year. Less aware of dyspnea on exertion. She questions if that problem was related to cat exposure. The cat is now gone. She is not needing Dulera or her nebulizer. Her rescue inhaler about 2 times daily she continues to feel theophylline as necessary, noticing chest tightness and wheeze if she misses a dose. Has a cataract which she relates to previous steroid exposure-discussed. CXR 01/03/11-  IMPRESSION:  Stable chest x-ray with no evidence of acute cardiac or pulmonary  process.  Original Report Authenticated By: Brandon Melnick, M.D.  01/22/11-99%, 99%, 97%, 456 meters Not limited by oxygenation -CY PFT 01/22/2011-mild obstructive airways disease a slight response to bronchodilator, air trapping, normal diffusion. FEV1 2.18/95%, FEV1/FVC 0.65, FEF 25-75% 1.19/44%. RV 120%, DLCO 91%.  12/24/12- 57yoF former smoker( 3ppd x 45 pack yrs) followed for allergic rhinitis, asthma FOLLOWS FOR: Breathing has improved since last OV. Elwin Sleight has been working well. Would like for her PFT results and results. No longer running- hip bursitis. Likes Dulera. Reviewed medications.  ROS-see HPI Constitutional:   No-   weight loss, night sweats, fevers, chills, fatigue, lassitude. HEENT:   No-  headaches, difficulty swallowing, tooth/dental problems, sore throat,       No-  sneezing, itching, ear ache,  nasal congestion, post nasal drip,  CV:  No-   chest pain, orthopnea, PND, swelling in lower extremities, anasarca, dizziness, palpitations Resp: + Minimal shortness of breath with exertion or at rest.              No-   productive cough,  + non-productive cough,  No- coughing up of blood.              No-   change in color of mucus.  No- wheezing.   Skin: No-   rash or lesions. GI:  No-   heartburn, indigestion, abdominal pain, nausea, vomiting,  GU:  MS:  No-   joint pain or swelling.  Neuro-     nothing unusual Psych:  No- change in mood or affect. No depression or anxiety.  No memory loss.  OBJ General- Alert, Oriented, Affect-appropriate, Distress- none acute, trim Skin- rash-none, lesions- none, excoriation- none Lymphadenopathy- none Head- atraumatic            Eyes- Gross vision intact, PERRLA, conjunctivae clear secretions            Ears- Hearing, canals-normal            Nose- Clear, no-Septal dev, mucus, polyps, erosion, perforation             Throat- Mallampati II , mucosa clear , drainage- none, tonsils- atrophic Neck- flexible , trachea midline, no stridor , thyroid nl, carotid no bruit Chest - symmetrical excursion , unlabored           Heart/CV- RRR , no murmur , no gallop  , no rub, nl s1 s2                           -  JVD- none , edema- none, stasis changes- none, varices- none           Lung- Unlabored, wheeze -none, cough- none , dullness-none, rub- none           Chest wall-  Abd-  Br/ Gen/ Rectal- Not done, not indicated Extrem- cyanosis- none, clubbing, none, atrophy- none, strength- nl Neuro- grossly intact to observation

## 2013-01-05 LAB — ALPHA-1 ANTITRYPSIN PHENOTYPE

## 2013-01-06 NOTE — Progress Notes (Signed)
Quick Note:  LMTCB ______ 

## 2013-01-07 NOTE — Progress Notes (Signed)
Quick Note:  Pt aware of results. ______ 

## 2013-01-15 NOTE — Assessment & Plan Note (Addendum)
6MWT 01/22/11-99%, 99%, 97%, 456 meters Not limited by oxygenation -CY PFT 01/22/2011-mild obstructive airways disease a slight response to bronchodilator, air trapping, normal diffusion. FEV1 2.18/95%, FEV1/FVC 0.65, FEF 25-75% 1.19/44%. RV 120%, DLCO 91%. Today-PFT results were reviewed with her. Currently good control, especially given her smoking history. Plan-alpha 1 antitrypsin assay. Continue present meds. Get any aerobic exercise she can, especially walking

## 2013-01-16 ENCOUNTER — Ambulatory Visit (INDEPENDENT_AMBULATORY_CARE_PROVIDER_SITE_OTHER): Payer: BC Managed Care – PPO | Admitting: Internal Medicine

## 2013-01-16 ENCOUNTER — Encounter: Payer: Self-pay | Admitting: Internal Medicine

## 2013-01-16 VITALS — BP 112/62 | HR 58 | Temp 97.0°F | Ht 63.0 in | Wt 142.8 lb

## 2013-01-16 DIAGNOSIS — M8580 Other specified disorders of bone density and structure, unspecified site: Secondary | ICD-10-CM | POA: Insufficient documentation

## 2013-01-16 DIAGNOSIS — Z23 Encounter for immunization: Secondary | ICD-10-CM

## 2013-01-16 DIAGNOSIS — M899 Disorder of bone, unspecified: Secondary | ICD-10-CM

## 2013-01-16 DIAGNOSIS — Z0000 Encounter for general adult medical examination without abnormal findings: Secondary | ICD-10-CM

## 2013-01-16 DIAGNOSIS — M949 Disorder of cartilage, unspecified: Secondary | ICD-10-CM

## 2013-01-16 NOTE — Progress Notes (Signed)
Pre-visit discussion using our clinic review tool. No additional management support is needed unless otherwise documented below in the visit note.  

## 2013-01-16 NOTE — Progress Notes (Signed)
Subjective:    Patient ID: Jessica Richards, female    DOB: 09/17/1955, 58 y.o.   MRN: [id number]  HPI  New Patient to me, here to establish primary care  patient is here today for annual physical. Patient feels well and has no complaints.   Past Medical History  Diagnosis Date  . Allergic rhinitis   . Asthma   . COPD bronchitis   . Osteopenia     DEXA @ gyn 11/909/10: -2.0 DEXA @ gyn 11/20/10: -1.8 DEXA @ gyn 11/20/12: -2.2  . Mild mitral regurgitation by prior echocardiogram 12/17/06  . Shingles 2012   Family History  Problem Relation Age of Onset  . Hypertension Mother     smoker  . Coronary artery disease Father     started in 6240s  . Coronary artery disease      female 1 st degree relative < 50-father   History  Substance Use Topics  . Smoking status: Former Smoker -- 3.00 packs/day for 15 years  . Smokeless tobacco: Never Used  . Alcohol Use: Not on file    Review of Systems  Constitutional: Negative for fatigue and unexpected weight change.  Respiratory: Negative for cough, shortness of breath and wheezing.   Cardiovascular: Negative for chest pain, palpitations and leg swelling.  Gastrointestinal: Negative for nausea, abdominal pain and diarrhea.  Neurological: Negative for dizziness, weakness, light-headedness and headaches.  Psychiatric/Behavioral: Negative for dysphoric mood. The patient is not nervous/anxious.   All other systems reviewed and are negative.       Objective:   Physical Exam BP 112/62  Pulse 58  Temp(Src) 97 F (36.1 C) (Oral)  Ht 5\' 3"  (1.6 m)  Wt 142 lb 12.8 oz (64.774 kg)  BMI 25.30 kg/m2  SpO2 97% Wt Readings from Last 3 Encounters:  01/16/13 142 lb 12.8 oz (64.774 kg)  12/24/12 143 lb (64.864 kg)  12/31/11 146 lb 9.6 oz (66.497 kg)   Constitutional: She appears well-developed and well-nourished. No distress.  HENT: Head: Normocephalic and atraumatic. Ears: B TMs ok, no erythema or effusion; Nose: Nose normal. Mouth/Throat:  Oropharynx is clear and moist. No oropharyngeal exudate.  Eyes: Conjunctivae and EOM are normal. Pupils are equal, round, and reactive to light. No scleral icterus.  Neck: Normal range of motion. Neck supple. No JVD present. No thyromegaly present.  Cardiovascular: Normal rate, regular rhythm and normal heart sounds.  No murmur heard. No BLE edema. Pulmonary/Chest: Effort normal and breath sounds normal. No respiratory distress. She has no wheezes.  Abdominal: Soft. Bowel sounds are normal. She exhibits no distension. There is no tenderness. no masses Musculoskeletal: Normal range of motion, no joint effusions. No gross deformities Neurological: She is alert and oriented to person, place, and time. No cranial nerve deficit. Coordination, balance, strength, speech and gait are normal.  Skin: Skin is warm and dry. No rash noted. No erythema.  Psychiatric: She has a normal mood and affect. Her behavior is normal. Judgment and thought content normal.   Lab Results  Component Value Date   WBC 5.1 07/14/2007   HGB 13.1 07/14/2007   HCT 39.2 07/14/2007   PLT 220 07/14/2007   GLUCOSE 83 07/14/2007   CHOL 177 09/03/2012   TRIG 45 07/14/2007   HDL 98* 09/03/2012   LDLCALC 65 09/03/2012   ALT 24 12/23/2009   AST 31 12/23/2009   NA 142 07/14/2007   K 4.0 07/14/2007   CL 107 07/14/2007   CREATININE 0.9 07/14/2007   BUN  18 07/14/2007   CO2 27 07/14/2007   TSH 1.07 07/14/2007        Assessment & Plan:   CPX/v70.0 - Patient has been counseled on age-appropriate routine health concerns for screening and prevention. These are reviewed and up-to-date. Immunizations are up-to-date or declined. Labs reviewed.

## 2013-01-16 NOTE — Patient Instructions (Addendum)
It was good to see you today.  We have reviewed your prior records including labs and tests today  Health Maintenance reviewed - pneumonia vaccine updated today -all other recommended immunizations and age-appropriate screenings are up-to-date.  Medications reviewed and updated, no changes recommended at this time.  Please schedule followup in 12-24 months for annual exam and labs, call sooner if problems.   Health Maintenance, Female A healthy lifestyle and preventative care can promote health and wellness.  Maintain regular health, dental, and eye exams.  Eat a healthy diet. Foods like vegetables, fruits, whole grains, low-fat dairy products, and lean protein foods contain the nutrients you need without too many calories. Decrease your intake of foods high in solid fats, added sugars, and salt. Get information about a proper diet from your caregiver, if necessary.  Regular physical exercise is one of the most important things you can do for your health. Most adults should get at least 150 minutes of moderate-intensity exercise (any activity that increases your heart rate and causes you to sweat) each week. In addition, most adults need muscle-strengthening exercises on 2 or more days a week.   Maintain a healthy weight. The body mass index (BMI) is a screening tool to identify possible weight problems. It provides an estimate of body fat based on height and weight. Your caregiver can help determine your BMI, and can help you achieve or maintain a healthy weight. For adults 20 years and older:  A BMI below 18.5 is considered underweight.  A BMI of 18.5 to 24.9 is normal.  A BMI of 25 to 29.9 is considered overweight.  A BMI of 30 and above is considered obese.  Maintain normal blood lipids and cholesterol by exercising and minimizing your intake of saturated fat. Eat a balanced diet with plenty of fruits and vegetables. Blood tests for lipids and cholesterol should begin at age 58 and  be repeated every 5 years. If your lipid or cholesterol levels are high, you are over 50, or you are a high risk for heart disease, you may need your cholesterol levels checked more frequently.Ongoing high lipid and cholesterol levels should be treated with medicines if diet and exercise are not effective.  If you smoke, find out from your caregiver how to quit. If you do not use tobacco, do not start.  Lung cancer screening is recommended for adults aged 70 80 years who are at high risk for developing lung cancer because of a history of smoking. Yearly low-dose computed tomography (CT) is recommended for people who have at least a 30-pack-year history of smoking and are a current smoker or have quit within the past 15 years. A pack year of smoking is smoking an average of 1 pack of cigarettes a day for 1 year (for example: 1 pack a day for 30 years or 2 packs a day for 15 years). Yearly screening should continue until the smoker has stopped smoking for at least 15 years. Yearly screening should also be stopped for people who develop a health problem that would prevent them from having lung cancer treatment.  If you are pregnant, do not drink alcohol. If you are breastfeeding, be very cautious about drinking alcohol. If you are not pregnant and choose to drink alcohol, do not exceed 1 drink per day. One drink is considered to be 12 ounces (355 mL) of beer, 5 ounces (148 mL) of wine, or 1.5 ounces (44 mL) of liquor.  Avoid use of street drugs. Do not share  needles with anyone. Ask for help if you need support or instructions about stopping the use of drugs.  High blood pressure causes heart disease and increases the risk of stroke. Blood pressure should be checked at least every 1 to 2 years. Ongoing high blood pressure should be treated with medicines, if weight loss and exercise are not effective.  If you are 58 to 58 years old, ask your caregiver if you should take aspirin to prevent  strokes.  Diabetes screening involves taking a blood sample to check your fasting blood sugar level. This should be done once every 3 years, after age 30, if you are within normal weight and without risk factors for diabetes. Testing should be considered at a younger age or be carried out more frequently if you are overweight and have at least 1 risk factor for diabetes.  Breast cancer screening is essential preventative care for women. You should practice "breast self-awareness." This means understanding the normal appearance and feel of your breasts and may include breast self-examination. Any changes detected, no matter how small, should be reported to a caregiver. Women in their 23s and 30s should have a clinical breast exam (CBE) by a caregiver as part of a regular health exam every 1 to 3 years. After age 58, women should have a CBE every year. Starting at age 58, women should consider having a mammogram (breast X-ray) every year. Women who have a family history of breast cancer should talk to their caregiver about genetic screening. Women at a high risk of breast cancer should talk to their caregiver about having an MRI and a mammogram every year.  Breast cancer gene (BRCA)-related cancer risk assessment is recommended for women who have family members with BRCA-related cancers. BRCA-related cancers include breast, ovarian, tubal, and peritoneal cancers. Having family members with these cancers may be associated with an increased risk for harmful changes (mutations) in the breast cancer genes BRCA1 and BRCA2. Results of the assessment will determine the need for genetic counseling and BRCA1 and BRCA2 testing.  The Pap test is a screening test for cervical cancer. Women should have a Pap test starting at age 58 Between ages 36 and 58, Pap tests should be repeated every 2 years. Beginning at age 29, you should have a Pap test every 3 years as long as the past 3 Pap tests have been normal. If you had a  hysterectomy for a problem that was not cancer or a condition that could lead to cancer, then you no longer need Pap tests. If you are between ages 25 and 11, and you have had normal Pap tests going back 10 years, you no longer need Pap tests. If you have had past treatment for cervical cancer or a condition that could lead to cancer, you need Pap tests and screening for cancer for at least 20 years after your treatment. If Pap tests have been discontinued, risk factors (such as a new sexual partner) need to be reassessed to determine if screening should be resumed. Some women have medical problems that increase the chance of getting cervical cancer. In these cases, your caregiver may recommend more frequent screening and Pap tests.  The human papillomavirus (HPV) test is an additional test that may be used for cervical cancer screening. The HPV test looks for the virus that can cause the cell changes on the cervix. The cells collected during the Pap test can be tested for HPV. The HPV test could be used to screen women  aged 79 years and older, and should be used in women of any age who have unclear Pap test results. After the age of 44, women should have HPV testing at the same frequency as a Pap test.  Colorectal cancer can be detected and often prevented. Most routine colorectal cancer screening begins at the age of 1 and continues through age 84. However, your caregiver may recommend screening at an earlier age if you have risk factors for colon cancer. On a yearly basis, your caregiver may provide home test kits to check for hidden blood in the stool. Use of a small camera at the end of a tube, to directly examine the colon (sigmoidoscopy or colonoscopy), can detect the earliest forms of colorectal cancer. Talk to your caregiver about this at age 36, when routine screening begins. Direct examination of the colon should be repeated every 5 to 10 years through age 46, unless early forms of pre-cancerous  polyps or small growths are found.  Hepatitis C blood testing is recommended for all people born from [birth year] through 1965 and any individual with known risks for hepatitis C.  Practice safe sex. Use condoms and avoid high-risk sexual practices to reduce the spread of sexually transmitted infections (STIs). Sexually active women aged 63 and younger should be checked for Chlamydia, which is a common sexually transmitted infection. Older women with new or multiple partners should also be tested for Chlamydia. Testing for other STIs is recommended if you are sexually active and at increased risk.  Osteoporosis is a disease in which the bones lose minerals and strength with aging. This can result in serious bone fractures. The risk of osteoporosis can be identified using a bone density scan. Women ages 97 and over and women at risk for fractures or osteoporosis should discuss screening with their caregivers. Ask your caregiver whether you should be taking a calcium supplement or vitamin D to reduce the rate of osteoporosis.  Menopause can be associated with physical symptoms and risks. Hormone replacement therapy is available to decrease symptoms and risks. You should talk to your caregiver about whether hormone replacement therapy is right for you.  Use sunscreen. Apply sunscreen liberally and repeatedly throughout the day. You should seek shade when your shadow is shorter than you. Protect yourself by wearing long sleeves, pants, a wide-brimmed hat, and sunglasses year round, whenever you are outdoors.  Notify your caregiver of new moles or changes in moles, especially if there is a change in shape or color. Also notify your caregiver if a mole is larger than the size of a pencil eraser.  Stay current with your immunizations. Document Released: 07/10/2010 Document Revised: 04/21/2012 Document Reviewed: 07/10/2010 [facility] Patient Information 2014 [place].

## 2013-03-12 ENCOUNTER — Other Ambulatory Visit: Payer: Self-pay | Admitting: Internal Medicine

## 2013-03-12 MED ORDER — MOMETASONE FURO-FORMOTEROL FUM 100-5 MCG/ACT IN AERO: 2.0000 | INHALATION_SPRAY | Freq: Two times a day (BID) | RESPIRATORY_TRACT | Status: DC

## 2013-03-12 NOTE — Telephone Encounter (Signed)
Rx sent electronically to pharmacy. 

## 2013-03-30 ENCOUNTER — Telehealth: Payer: Self-pay | Admitting: Internal Medicine

## 2013-03-30 MED ORDER — THEOPHYLLINE ER 200 MG PO TB12: ORAL_TABLET | ORAL | Status: DC

## 2013-03-30 NOTE — Telephone Encounter (Signed)
Called and spoke with pt. Aware RX has been sent to [facility]. She did not need it sent to mail order pharm yet

## 2013-05-22 ENCOUNTER — Other Ambulatory Visit: Payer: Self-pay | Admitting: Internal Medicine

## 2013-05-22 MED ORDER — ALBUTEROL SULFATE HFA 108 (90 BASE) MCG/ACT IN AERS: INHALATION_SPRAY | RESPIRATORY_TRACT | Status: DC

## 2013-05-22 NOTE — Telephone Encounter (Signed)
Rx refill sent electronically. Nothing more needed at this time.

## 2013-08-30 IMAGING — CR DG CHEST 2V
2 series · 2 of 2 positions shown · non-contrast
Comparison: None.

CLINICAL DATA: Asthma.  Former smoker.

CHEST - 2 VIEW

[view not recorded (1 of 2)]
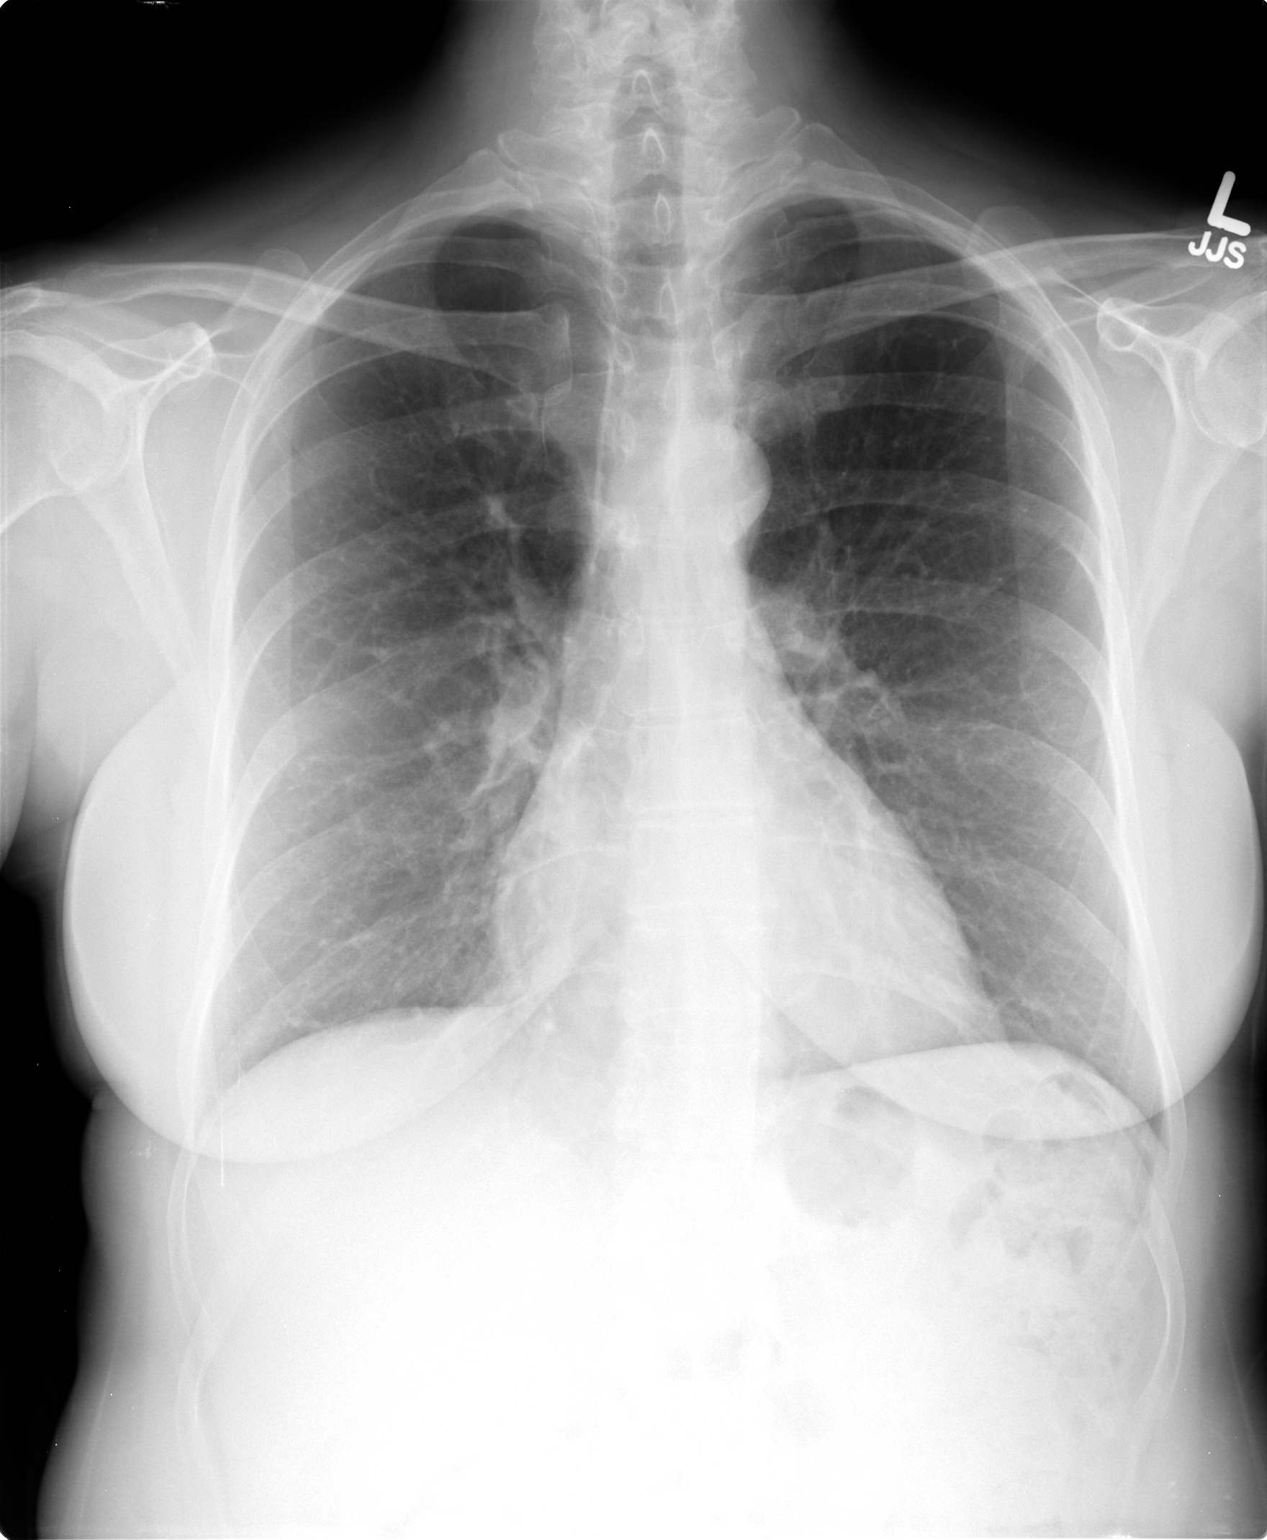

[view not recorded (2 of 2)]
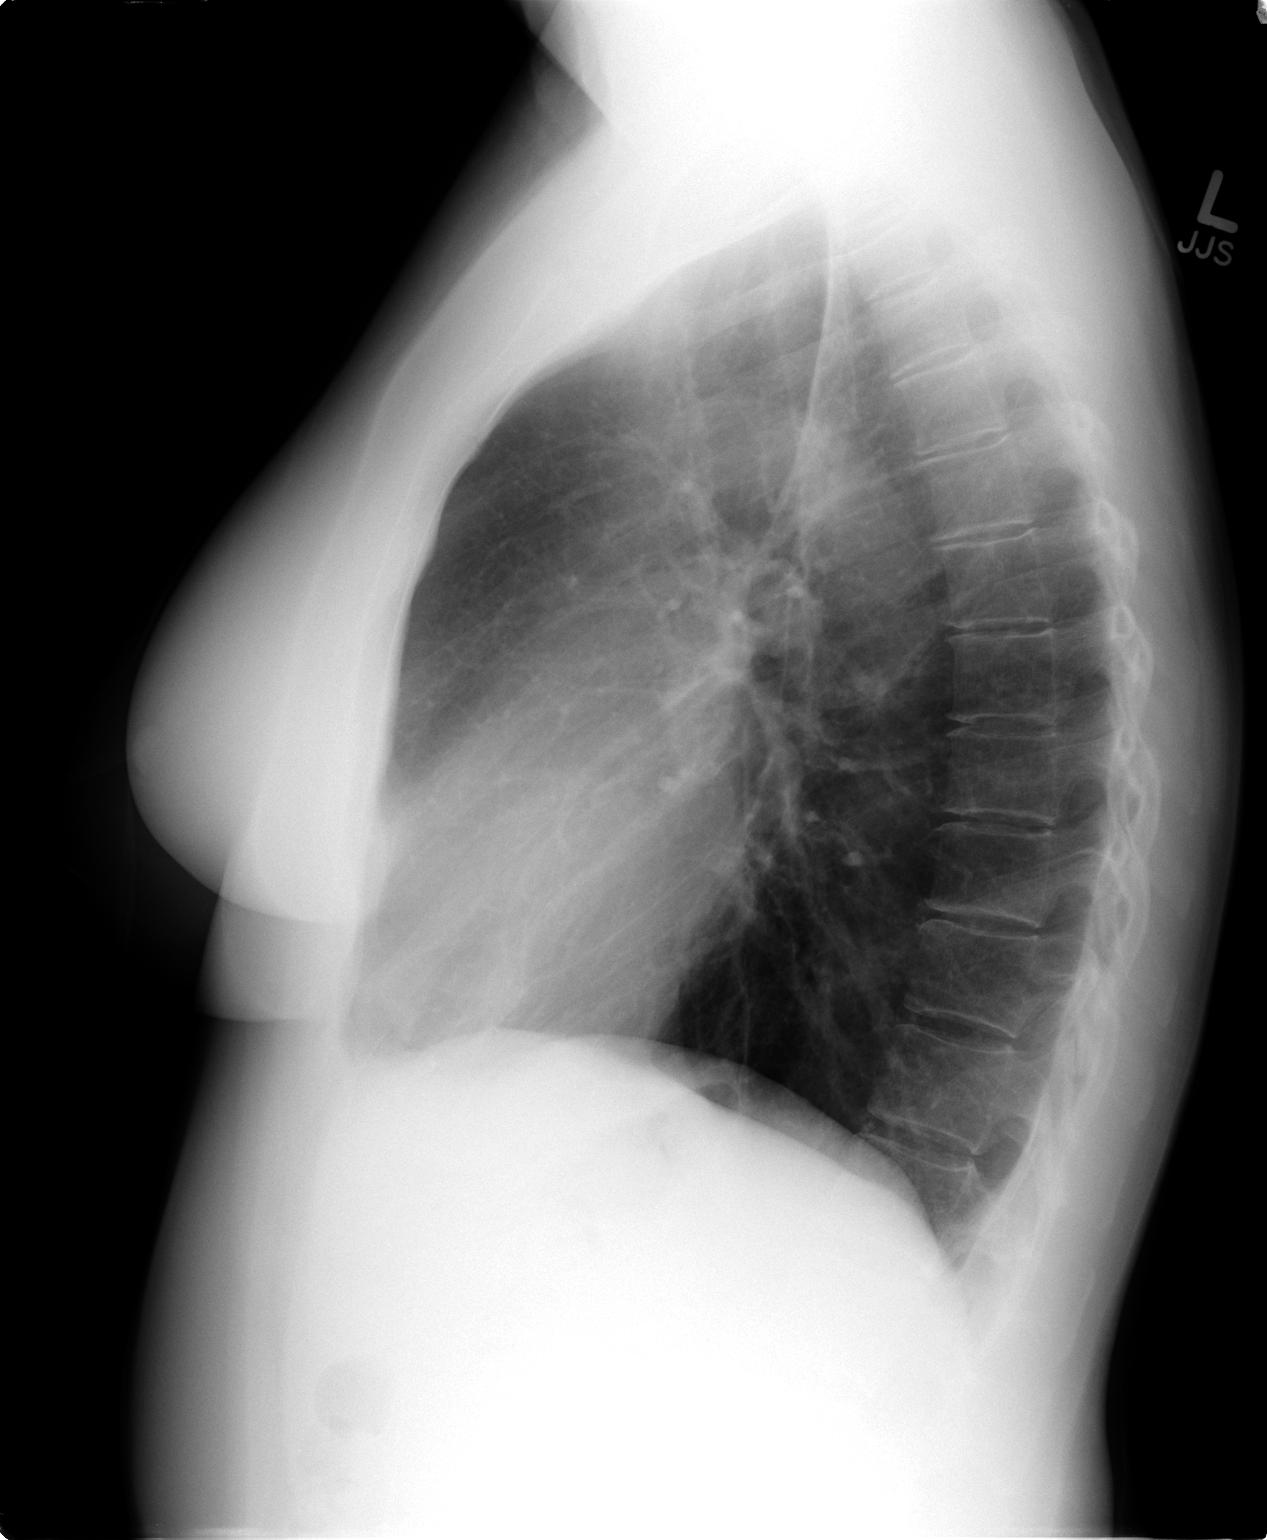

[2 of 2 positions shown; findings below may reference images not displayed]

FINDINGS: The chest is hyperexpanded with attenuation of the
pulmonary vasculature.  The lungs are clear.  Heart size is normal.
No pneumothorax or pleural effusion.
IMPRESSION: Findings compatible with COPD.  No acute abnormality.

## 2013-09-10 ENCOUNTER — Telehealth: Payer: Self-pay | Admitting: Internal Medicine

## 2013-09-10 MED ORDER — ALBUTEROL SULFATE (2.5 MG/3ML) 0.083% IN NEBU: INHALATION_SOLUTION | RESPIRATORY_TRACT | Status: DC

## 2013-09-10 NOTE — Telephone Encounter (Signed)
LMOM that refill was sent

## 2013-10-23 ENCOUNTER — Other Ambulatory Visit: Payer: Self-pay

## 2013-11-18 ENCOUNTER — Other Ambulatory Visit: Payer: Self-pay | Admitting: Internal Medicine

## 2013-12-16 ENCOUNTER — Other Ambulatory Visit: Payer: Self-pay | Admitting: Obstetrics and Gynecology

## 2013-12-17 LAB — CYTOLOGY - PAP

## 2013-12-21 ENCOUNTER — Other Ambulatory Visit: Payer: Self-pay | Admitting: Obstetrics and Gynecology

## 2013-12-21 DIAGNOSIS — R928 Other abnormal and inconclusive findings on diagnostic imaging of breast: Secondary | ICD-10-CM

## 2013-12-24 ENCOUNTER — Ambulatory Visit (INDEPENDENT_AMBULATORY_CARE_PROVIDER_SITE_OTHER)
Admission: RE | Admit: 2013-12-24 | Discharge: 2013-12-24 | Disposition: A | Discharge status: Home / Self Care | Payer: BC Managed Care – PPO | Source: Ambulatory Visit | Attending: Internal Medicine | Admitting: Internal Medicine

## 2013-12-24 ENCOUNTER — Ambulatory Visit (INDEPENDENT_AMBULATORY_CARE_PROVIDER_SITE_OTHER): Payer: BC Managed Care – PPO | Admitting: Internal Medicine

## 2013-12-24 ENCOUNTER — Encounter: Payer: Self-pay | Admitting: Internal Medicine

## 2013-12-24 VITALS — BP 108/60 | HR 59 | Ht 62.75 in | Wt 149.6 lb

## 2013-12-24 DIAGNOSIS — J209 Acute bronchitis, unspecified: Secondary | ICD-10-CM

## 2013-12-24 DIAGNOSIS — J45909 Unspecified asthma, uncomplicated: Secondary | ICD-10-CM

## 2013-12-24 DIAGNOSIS — J4530 Mild persistent asthma, uncomplicated: Secondary | ICD-10-CM

## 2013-12-24 NOTE — Patient Instructions (Signed)
We can continue present meds  Order- CXR   Dx asthma mild persistent well controlled

## 2013-12-24 NOTE — Progress Notes (Signed)
12/22/10- 55yoF former smoker followed for allergic rhinitis, asthma LOV- 12/23/2009 Has had flu vaccine. She has noticed more wheeze and more need for her rescue inhaler since spring of this year. Cough and chest rattle but usually not productive. Often wakes tight in her chest. She is no longer exercising regularly despite some encouragement from her husband. Zyflo was too expensive. She has increased her theophylline to 400 mg twice daily.  12/31/11- 56yoF former smoker followed for allergic rhinitis, asthma FOLLOWS FOR: review PFT and 6MW with patient; denies any flare ups at this time. Been a good year. Less aware of dyspnea on exertion. She questions if that problem was related to cat exposure. The cat is now gone. She is not needing Dulera or her nebulizer. Her rescue inhaler about 2 times daily she continues to feel theophylline as necessary, noticing chest tightness and wheeze if she misses a dose. Has a cataract which she relates to previous steroid exposure-discussed. CXR 01/03/11-  IMPRESSION:  Stable chest x-ray with no evidence of acute cardiac or pulmonary  process.  Original Report Authenticated By: Brandon MelnickARON B. DOVER, M.D.  6MWT 01/22/11-99%, 99%, 97%, 456 meters Not limited by oxygenation -CY PFT 01/22/2011-mild obstructive airways disease a slight response to bronchodilator, air trapping, normal diffusion. FEV1 2.18/95%, FEV1/FVC 0.65, FEF 25-75% 1.19/44%. RV 120%, DLCO 91%.  12/24/12- 57yoF former smoker( 3ppd x 45 pack yrs) followed for allergic rhinitis, asthma FOLLOWS FOR: Breathing has improved since last OV. Elwin SleightDulera has been working well. Would like for her PFT results and 6MW results. No longer running- hip bursitis. Likes Dulera. Reviewed medications.  12/24/13- 58yoF former smoker( 3ppd x 45 pack yrs) followed for allergic rhinitis, asthma Follows For:  SOB with stairs.  Denies wheezing or cough Gets flu shot at work. Grieving-mother died this fall. Not exercising  regularly. Dyspnea on exertion after climbing 3 flights of stairs at work, stops to get her breath. Little cough or wheeze. Meds okay-discussed.  CXR 12/24/13 IMPRESSION: Mild pleural parenchymal thickening right lung base consistent with scarring. No acute cardiopulmonary disease . Electronically Signed  By: Maisie Fushomas Register  On: 12/24/2012 10:02  ROS-see HPI Constitutional:   No-   weight loss, night sweats, fevers, chills, fatigue, lassitude. HEENT:   No-  headaches, difficulty swallowing, tooth/dental problems, sore throat,       No-  sneezing, itching, ear ache, nasal congestion, post nasal drip,  CV:  No-   chest pain, orthopnea, PND, swelling in lower extremities, anasarca, dizziness, palpitations Resp: +shortness of breath with exertion .              No-   productive cough,  No- non-productive cough,  No- coughing up of blood.              No-   change in color of mucus.  No- wheezing.   Skin: No-   rash or lesions. GI:  No-   heartburn, indigestion, abdominal pain, nausea, vomiting,  GU:  MS:  No-   joint pain or swelling.  Neuro-     nothing unusual Psych:  No- change in mood or affect. No depression or anxiety.  No memory loss.  OBJ General- Alert, Oriented, Affect-appropriate, Distress- none acute, trim Skin- rash-none, lesions- none, excoriation- none Lymphadenopathy- none Head- atraumatic            Eyes- Gross vision intact, PERRLA, conjunctivae clear secretions            Ears- Hearing, canals-normal  Nose- Clear, no-Septal dev, mucus, polyps, erosion, perforation             Throat- Mallampati II , mucosa clear , drainage- none, tonsils- atrophic Neck- flexible , trachea midline, no stridor , thyroid nl, carotid no bruit Chest - symmetrical excursion , unlabored           Heart/CV- RRR , no murmur , no gallop  , no rub, nl s1 s2                           - JVD- none , edema- none, stasis changes- none, varices- none           Lung- Unlabored, wheeze  -none, cough- none , dullness-none, rub- none           Chest wall-  Abd-  Br/ Gen/ Rectal- Not done, not indicated Extrem- cyanosis- none, clubbing, none, atrophy- none, strength- nl Neuro- grossly intact to observation

## 2013-12-25 NOTE — Progress Notes (Signed)
Quick Note:    Lmtcb  ______

## 2013-12-26 NOTE — Assessment & Plan Note (Signed)
Good control with several maintenance medicines which we reviewed and discussed. Her dyspnea on exertion is more consistent with some deconditioning, but she did have mild small airway obstruction on PFTs in 2013. We talked about when to recheck.

## 2013-12-28 ENCOUNTER — Telehealth: Payer: Self-pay | Admitting: Internal Medicine

## 2013-12-28 NOTE — Telephone Encounter (Signed)
Notes Recorded by Waymon Budgelinton D Young, MD on 12/24/2013 at 1:38 PM CXR- lungs are somewhat over-inflated, but there is nothing new or active seen. -------------------------------------------------------------------  Pt aware of results.  Nothing further needed.

## 2013-12-28 NOTE — Progress Notes (Signed)
Quick Note:  Pt aware of results. ______ 

## 2014-01-12 ENCOUNTER — Ambulatory Visit (HOSPITAL_COMMUNITY): Admission: RE | Admit: 2014-01-12 | Payer: BLUE CROSS/BLUE SHIELD | Source: Ambulatory Visit

## 2014-01-12 ENCOUNTER — Other Ambulatory Visit: Payer: Self-pay | Admitting: Obstetrics and Gynecology

## 2014-01-12 ENCOUNTER — Ambulatory Visit
Admission: RE | Admit: 2014-01-12 | Discharge: 2014-01-12 | Disposition: A | Discharge status: Home / Self Care | Payer: BLUE CROSS/BLUE SHIELD | Source: Ambulatory Visit | Attending: Obstetrics and Gynecology | Admitting: Obstetrics and Gynecology

## 2014-01-12 DIAGNOSIS — R928 Other abnormal and inconclusive findings on diagnostic imaging of breast: Secondary | ICD-10-CM

## 2014-01-13 ENCOUNTER — Other Ambulatory Visit: Payer: Self-pay | Admitting: Obstetrics and Gynecology

## 2014-01-13 ENCOUNTER — Other Ambulatory Visit: Payer: Self-pay | Admitting: Internal Medicine

## 2014-01-13 DIAGNOSIS — R928 Other abnormal and inconclusive findings on diagnostic imaging of breast: Secondary | ICD-10-CM

## 2014-01-14 ENCOUNTER — Other Ambulatory Visit: Payer: Self-pay | Admitting: Internal Medicine

## 2014-01-17 ENCOUNTER — Other Ambulatory Visit: Payer: Self-pay | Admitting: Internal Medicine

## 2014-04-19 ENCOUNTER — Other Ambulatory Visit: Payer: Self-pay | Admitting: Internal Medicine

## 2014-04-21 ENCOUNTER — Telehealth: Payer: Self-pay | Admitting: Internal Medicine

## 2014-04-21 MED ORDER — THEOPHYLLINE ER 400 MG PO TB24: 400.0000 mg | ORAL_TABLET | Freq: Every day | ORAL | Status: DC

## 2014-04-21 NOTE — Telephone Encounter (Signed)
I would like to stay as I recommended with 400 mg ER, once daily. That starts her with a lower total milligram dose first. If it doesn't give enough control, we can change.

## 2014-04-21 NOTE — Telephone Encounter (Signed)
No- that would double her dose, since she has been on 200 mg 3 times daily  Recommend trying theophylline 400 mg ER, one daily, # 90 for 3 months, ref x 3

## 2014-04-21 NOTE — Telephone Encounter (Signed)
Pharmacy wanted to make sure you were aware that the 200mg  she was taking before was ER.  She was takign 3 200mg ER tablets daily.  [facility] wants to be sure that you still want the following recommendations:  Waymon Budgelinton D Young, MD Physician Signed  Service date: 04/21/2014 12:04 PM       Expand All Collapse All   No- that would double her dose, since she has been on 200 mg 3 times daily  Recommend trying theophylline 400 mg ER, one daily, # 90 for 3 months, ref x 3

## 2014-04-21 NOTE — Telephone Encounter (Signed)
Called and [facility] - verbal given for dose. This has also been added to the patient's medlist.  LMTCB x 1 for pt

## 2014-04-21 NOTE — Telephone Encounter (Signed)
[facility] called.  She said rx given to pt for theophylline 200mg , it may be discontiuned. we could do 400mg  tid. Is this okay?  No Known Allergies Current Outpatient Prescriptions on File Prior to Visit  Medication Sig Dispense Refill  . albuterol (PROVENTIL) (2.5 MG/3ML) 0.083% nebulizer solution USE 1 VIAL IN NEBULIZER 4 TIMES A DAY AS NEEDED 75 mL PRN  . estrogen, conjugated,-medroxyprogesterone (PREMPRO) 0.625-2.5 MG per tablet Take 1 tablet by mouth daily.      Marland Kitchen. ibandronate (BONIVA) 150 MG tablet Take 1 tablet once a month    . mometasone-formoterol (DULERA) 100-5 MCG/ACT AERO Inhale 2 puffs into the lungs 2 (two) times daily. 3 Inhaler 2  . Multiple Vitamin (MULITIVITAMIN WITH MINERALS) TABS Take 2 tablets by mouth daily.      Marland Kitchen. PROAIR HFA 108 (90 BASE) MCG/ACT inhaler USE 2 INHALATIONS ORALLY   INTO THE LUNGS EVERY 6     HOURS AS NEEDEDFOR        WHEEZING 25.5 g 3  . Spacer/Aero-Holding Chambers (AEROCHAMBER MV) inhaler by Other route. Use as instructed     . SPIRIVA HANDIHALER 18 MCG inhalation capsule INHALE THE CONTENTS OF ONE CAPSULE DAILY 90 capsule 3  . theophylline (THEODUR) 200 MG 12 hr tablet TAKE 1 TABLET BY MOUTH 3 TIMES A DAY 270 tablet 1   No current facility-administered medications on file prior to visit.

## 2014-04-22 NOTE — Telephone Encounter (Signed)
LMTCB x2 for pt 

## 2014-04-22 NOTE — Telephone Encounter (Signed)
Spoke with pt and advised of Dr Roxy CedarYoung's instructions for dose of Theophylline.  Pt verbalized understanding.

## 2014-04-22 NOTE — Telephone Encounter (Signed)
Pt returned call [phone]

## 2014-06-03 ENCOUNTER — Other Ambulatory Visit: Payer: Self-pay | Admitting: Internal Medicine

## 2014-07-17 ENCOUNTER — Other Ambulatory Visit: Payer: Self-pay | Admitting: Internal Medicine

## 2014-10-08 ENCOUNTER — Telehealth: Payer: Self-pay | Admitting: Internal Medicine

## 2014-10-08 NOTE — Telephone Encounter (Signed)
[facility]  She states Pennsaid was actually prescribed by Dr. Shon Baton  She states nothing needed from our office

## 2014-10-14 ENCOUNTER — Telehealth: Payer: Self-pay | Admitting: Internal Medicine

## 2014-10-14 NOTE — Telephone Encounter (Signed)
Rec'd from [facility] forward 3 pages to Dr. Felicity Coyer

## 2014-10-21 ENCOUNTER — Other Ambulatory Visit: Payer: Self-pay | Admitting: Internal Medicine

## 2014-12-27 ENCOUNTER — Ambulatory Visit (INDEPENDENT_AMBULATORY_CARE_PROVIDER_SITE_OTHER)
Admission: RE | Admit: 2014-12-27 | Discharge: 2014-12-27 | Disposition: A | Discharge status: Home / Self Care | Payer: BLUE CROSS/BLUE SHIELD | Source: Ambulatory Visit | Attending: Internal Medicine | Admitting: Internal Medicine

## 2014-12-27 ENCOUNTER — Encounter: Payer: Self-pay | Admitting: Internal Medicine

## 2014-12-27 ENCOUNTER — Ambulatory Visit: Payer: BLUE CROSS/BLUE SHIELD | Admitting: Internal Medicine

## 2014-12-27 VITALS — BP 104/64 | HR 60 | Wt 138.6 lb

## 2014-12-27 DIAGNOSIS — J4530 Mild persistent asthma, uncomplicated: Secondary | ICD-10-CM

## 2014-12-27 NOTE — Patient Instructions (Signed)
Order- CXR  Dx asthma mild persistent   Order- Office spirometry     We can continue present meds - please call as needed

## 2014-12-27 NOTE — Progress Notes (Signed)
12/22/10- 55yoF former smoker followed for allergic rhinitis, asthma LOV- 12/23/2009 Has had flu vaccine. She has noticed more wheeze and more need for her rescue inhaler since spring of this year. Cough and chest rattle but usually not productive. Often wakes tight in her chest. She is no longer exercising regularly despite some encouragement from her husband. Zyflo was too expensive. She has increased her theophylline to 400 mg twice daily.  12/31/11- 56yoF former smoker followed for allergic rhinitis, asthma FOLLOWS FOR: review PFT and with patient; denies any flare ups at this time. Been a good year. Less aware of dyspnea on exertion. She questions if that problem was related to cat exposure. The cat is now gone. She is not needing Dulera or her nebulizer. Her rescue inhaler about 2 times daily she continues to feel theophylline as necessary, noticing chest tightness and wheeze if she misses a dose. Has a cataract which she relates to previous steroid exposure-discussed. CXR 01/03/11-  IMPRESSION:  Stable chest x-ray with no evidence of acute cardiac or pulmonary  process.  Original Report Authenticated By: Brandon Melnick, M.D.  01/22/11-99%, 99%, 97%, 456 meters Not limited by oxygenation -CY PFT 01/22/2011-mild obstructive airways disease a slight response to bronchodilator, air trapping, normal diffusion. FEV1 2.18/95%, FEV1/FVC 0.65, FEF 25-75% 1.19/44%. RV 120%, DLCO 91%.  12/24/12- 57yoF former smoker( 3ppd x 45 pack yrs) followed for allergic rhinitis, asthma FOLLOWS FOR: Breathing has improved since last OV. Elwin Sleight has been working well. Would like for her PFT results and results. No longer running- hip bursitis. Likes Dulera. Reviewed medications.  12/24/13- 58yoF former smoker( 3ppd x 45 pack yrs) followed for allergic rhinitis, asthma Follows For:  SOB with stairs.  Denies wheezing or cough Gets flu shot at work. Grieving-mother died this fall. Not exercising  regularly. Dyspnea on exertion after climbing 3 flights of stairs at work, stops to get her breath. Little cough or wheeze. Meds okay-discussed.  CXR 12/24/12 IMPRESSION: Mild pleural parenchymal thickening right lung base consistent with scarring. No acute cardiopulmonary disease . Electronically Signed  By: Maisie Fus Register  On: 12/24/2012 10:02  12/27/2014-59 year old female former smoker( 3ppd x 45 pack yrs) followed for allergic rhinitis, asthma FOLLOWS FOR. Pt states breathing has improved since last visit. Some SOB climbing stairs. Denies wheezing or cough    CXR 12/24/2013 IMPRESSION: COPD. There is no active cardiopulmonary disease. Electronically Signed  By: David [place]and  On: 12/24/2013 11:33  ROS-see HPI Constitutional:   No-   weight loss, night sweats, fevers, chills, fatigue, lassitude. HEENT:   No-  headaches, difficulty swallowing, tooth/dental problems, sore throat,       No-  sneezing, itching, ear ache, nasal congestion, post nasal drip,  CV:  No-   chest pain, orthopnea, PND, swelling in lower extremities, anasarca, dizziness, palpitations Resp: +shortness of breath with exertion .              No-   productive cough,  No- non-productive cough,  No- coughing up of blood.              No-   change in color of mucus.  No- wheezing.   Skin: No-   rash or lesions. GI:  No-   heartburn, indigestion, abdominal pain, nausea, vomiting,  GU:  MS:  No-   joint pain or swelling.  Neuro-     nothing unusual Psych:  No- change in mood or affect. No depression or anxiety.  No memory loss.  OBJ  General- Alert, Oriented, Affect-appropriate, Distress- none acute, trim Skin- rash-none, lesions- none, excoriation- none Lymphadenopathy- none Head- atraumatic            Eyes- Gross vision intact, PERRLA, conjunctivae clear secretions            Ears- Hearing, canals-normal            Nose- Clear, no-Septal dev, mucus, polyps, erosion, perforation             Throat-  Mallampati II , mucosa clear , drainage- none, tonsils- atrophic Neck- flexible , trachea midline, no stridor , thyroid nl, carotid no bruit Chest - symmetrical excursion , unlabored           Heart/CV- RRR , no murmur , no gallop  , no rub, nl s1 s2                           - JVD- none , edema- none, stasis changes- none, varices- none           Lung- Unlabored, wheeze -none, cough- none , dullness-none, rub- none           Chest wall-  Abd-  Br/ Gen/ Rectal- Not done, not indicated Extrem- cyanosis- none, clubbing, none, atrophy- none, strength- nl Neuro- grossly intact to observation

## 2014-12-29 ENCOUNTER — Telehealth: Payer: Self-pay | Admitting: *Deleted

## 2014-12-29 NOTE — Telephone Encounter (Signed)
Attempted to contact patient regarding Spirometry and CXR results. Left message for patient to call back.   Notes Recorded by Waymon Budgelinton D Young, MD on 12/27/2014 at 12:14 PM Office spirometry-again shows only mild obstruction to airflow similar to previous scores  Notes Recorded by Waymon Budgelinton D Young, MD on 12/27/2014 at 12:12 PM CXR-stable with no apparent active process. We continue to see mild overinflation and minimal scarring similar to one year ago.

## 2014-12-29 NOTE — Telephone Encounter (Signed)
Pt returned call [phone]

## 2014-12-29 NOTE — Telephone Encounter (Signed)
Spoke with pt. She is aware of results. Nothing further was needed.  

## 2015-01-21 ENCOUNTER — Encounter: Payer: Self-pay | Admitting: Internal Medicine

## 2015-01-21 ENCOUNTER — Ambulatory Visit (INDEPENDENT_AMBULATORY_CARE_PROVIDER_SITE_OTHER): Payer: BLUE CROSS/BLUE SHIELD | Admitting: Internal Medicine

## 2015-01-21 VITALS — BP 102/70 | HR 55 | Temp 98.2°F | Resp 16 | Ht 62.5 in | Wt 141.1 lb

## 2015-01-21 DIAGNOSIS — Z0000 Encounter for general adult medical examination without abnormal findings: Secondary | ICD-10-CM

## 2015-01-21 DIAGNOSIS — M8580 Other specified disorders of bone density and structure, unspecified site: Secondary | ICD-10-CM | POA: Diagnosis not present

## 2015-01-21 NOTE — Assessment & Plan Note (Signed)
Follows with gyn and taking boniva. Hers is caused by long term exposure to steroids.

## 2015-01-21 NOTE — Progress Notes (Signed)
Pre visit review using our clinic review tool, if applicable. No additional management support is needed unless otherwise documented below in the visit note. 

## 2015-01-21 NOTE — Assessment & Plan Note (Signed)
Asthma good control and following with pulmonary, copy of recent labs from her work scanned into the computer. No indication for change. Non-smoker and counseled not to resume. Screening up to date and immunizations up to date.

## 2015-01-21 NOTE — Patient Instructions (Signed)
Come back in about 1 year for a check up.   If you have any problems or questions before then please feel free to call the office.   Health Maintenance, Female Adopting a healthy lifestyle and getting preventive care can go a long way to promote health and wellness. Talk with your health care provider about what schedule of regular examinations is right for you. This is a good chance for you to check in with your provider about disease prevention and staying healthy. In between checkups, there are plenty of things you can do on your own. Experts have done a lot of research about which lifestyle changes and preventive measures are most likely to keep you healthy. Ask your health care provider for more information. WEIGHT AND DIET  Eat a healthy diet  Be sure to include plenty of vegetables, fruits, low-fat dairy products, and lean protein.  Do not eat a lot of foods high in solid fats, added sugars, or salt.  Get regular exercise. This is one of the most important things you can do for your health.  Most adults should exercise for at least 150 minutes each week. The exercise should increase your heart rate and make you sweat (moderate-intensity exercise).  Most adults should also do strengthening exercises at least twice a week. This is in addition to the moderate-intensity exercise.  Maintain a healthy weight  Body mass index (BMI) is a measurement that can be used to identify possible weight problems. It estimates body fat based on height and weight. Your health care provider can help determine your BMI and help you achieve or maintain a healthy weight.  For females 52 years of age and older:   A BMI below 18.5 is considered underweight.  A BMI of 18.5 to 24.9 is normal.  A BMI of 25 to 29.9 is considered overweight.  A BMI of 30 and above is considered obese.  Watch levels of cholesterol and blood lipids  You should start having your blood tested for lipids and cholesterol at  59 years of age, then have this test every 5 years.  You may need to have your cholesterol levels checked more often if:  Your lipid or cholesterol levels are high.  You are older than 60 years of age.  You are at high risk for heart disease.  CANCER SCREENING   Lung Cancer  Lung cancer screening is recommended for adults 37-9 years old who are at high risk for lung cancer because of a history of smoking.  A yearly low-dose CT scan of the lungs is recommended for people who:  Currently smoke.  Have quit within the past 15 years.  Have at least a 30-pack-year history of smoking. A pack year is smoking an average of one pack of cigarettes a day for 1 year.  Yearly screening should continue until it has been 15 years since you quit.  Yearly screening should stop if you develop a health problem that would prevent you from having lung cancer treatment.  Breast Cancer  Practice breast self-awareness. This means understanding how your breasts normally appear and feel.  It also means doing regular breast self-exams. Let your health care provider know about any changes, no matter how small.  If you are in your 20s or 30s, you should have a clinical breast exam (CBE) by a health care provider every 1-3 years as part of a regular health exam.  If you are 48 or older, have a CBE every year. Also  consider having a breast X-ray (mammogram) every year.  If you have a family history of breast cancer, talk to your health care provider about genetic screening.  If you are at high risk for breast cancer, talk to your health care provider about having an MRI and a mammogram every year.  Breast cancer gene (BRCA) assessment is recommended for women who have family members with BRCA-related cancers. BRCA-related cancers include:  Breast.  Ovarian.  Tubal.  Peritoneal cancers.  Results of the assessment will determine the need for genetic counseling and BRCA1 and BRCA2  testing. Cervical Cancer Your health care provider may recommend that you be screened regularly for cancer of the pelvic organs (ovaries, uterus, and vagina). This screening involves a pelvic examination, including checking for microscopic changes to the surface of your cervix (Pap test). You may be encouraged to have this screening done every 3 years, beginning at age 31.  For women ages 67-65, health care providers may recommend pelvic exams and Pap testing every 3 years, or they may recommend the Pap and pelvic exam, combined with testing for human papilloma virus (HPV), every 5 years. Some types of HPV increase your risk of cervical cancer. Testing for HPV may also be done on women of any age with unclear Pap test results.  Other health care providers may not recommend any screening for nonpregnant women who are considered low risk for pelvic cancer and who do not have symptoms. Ask your health care provider if a screening pelvic exam is right for you.  If you have had past treatment for cervical cancer or a condition that could lead to cancer, you need Pap tests and screening for cancer for at least 20 years after your treatment. If Pap tests have been discontinued, your risk factors (such as having a new sexual partner) need to be reassessed to determine if screening should resume. Some women have medical problems that increase the chance of getting cervical cancer. In these cases, your health care provider may recommend more frequent screening and Pap tests. Colorectal Cancer  This type of cancer can be detected and often prevented.  Routine colorectal cancer screening usually begins at 60 years of age and continues through 60 years of age.  Your health care provider may recommend screening at an earlier age if you have risk factors for colon cancer.  Your health care provider may also recommend using home test kits to check for hidden blood in the stool.  A small camera at the end of a  tube can be used to examine your colon directly (sigmoidoscopy or colonoscopy). This is done to check for the earliest forms of colorectal cancer.  Routine screening usually begins at age 25.  Direct examination of the colon should be repeated every 5-10 years through 60 years of age. However, you may need to be screened more often if early forms of precancerous polyps or small growths are found. Skin Cancer  Check your skin from head to toe regularly.  Tell your health care provider about any new moles or changes in moles, especially if there is a change in a mole's shape or color.  Also tell your health care provider if you have a mole that is larger than the size of a pencil eraser.  Always use sunscreen. Apply sunscreen liberally and repeatedly throughout the day.  Protect yourself by wearing long sleeves, pants, a wide-brimmed hat, and sunglasses whenever you are outside. HEART DISEASE, DIABETES, AND HIGH BLOOD PRESSURE   High  blood pressure causes heart disease and increases the risk of stroke. High blood pressure is more likely to develop in:  People who have blood pressure in the high end of the normal range (130-139/85-89 mm Hg).  People who are overweight or obese.  People who are African American.  If you are 30-90 years of age, have your blood pressure checked every 3-5 years. If you are 83 years of age or older, have your blood pressure checked every year. You should have your blood pressure measured twice--once when you are at a hospital or clinic, and once when you are not at a hospital or clinic. Record the average of the two measurements. To check your blood pressure when you are not at a hospital or clinic, you can use:  An automated blood pressure machine at a pharmacy.  A home blood pressure monitor.  If you are between 66 years and 43 years old, ask your health care provider if you should take aspirin to prevent strokes.  Have regular diabetes screenings. This  involves taking a blood sample to check your fasting blood sugar level.  If you are at a normal weight and have a low risk for diabetes, have this test once every three years after 60 years of age.  If you are overweight and have a high risk for diabetes, consider being tested at a younger age or more often. PREVENTING INFECTION  Hepatitis B  If you have a higher risk for hepatitis B, you should be screened for this virus. You are considered at high risk for hepatitis B if:  You were born in a country where hepatitis B is common. Ask your health care provider which countries are considered high risk.  Your parents were born in a high-risk country, and you have not been immunized against hepatitis B (hepatitis B vaccine).  You have HIV or AIDS.  You use needles to inject street drugs.  You live with someone who has hepatitis B.  You have had sex with someone who has hepatitis B.  You get hemodialysis treatment.  You take certain medicines for conditions, including cancer, organ transplantation, and autoimmune conditions. Hepatitis C  Blood testing is recommended for:  Everyone born from [birth year] through 1965.  Anyone with known risk factors for hepatitis C. Sexually transmitted infections (STIs)  You should be screened for sexually transmitted infections (STIs) including gonorrhea and chlamydia if:  You are sexually active and are younger than 60 years of age.  You are older than 60 years of age and your health care provider tells you that you are at risk for this type of infection.  Your sexual activity has changed since you were last screened and you are at an increased risk for chlamydia or gonorrhea. Ask your health care provider if you are at risk.  If you do not have HIV, but are at risk, it may be recommended that you take a prescription medicine daily to prevent HIV infection. This is called pre-exposure prophylaxis (PrEP). You are considered at risk if:  You are  sexually active and do not regularly use condoms or know the HIV status of your partner(s).  You take drugs by injection.  You are sexually active with a partner who has HIV. Talk with your health care provider about whether you are at high risk of being infected with HIV. If you choose to begin PrEP, you should first be tested for HIV. You should then be tested every 3 months for as long  as you are taking PrEP.  PREGNANCY   If you are premenopausal and you may become pregnant, ask your health care provider about preconception counseling.  If you may become pregnant, take 400 to 800 micrograms (mcg) of folic acid every day.  If you want to prevent pregnancy, talk to your health care provider about birth control (contraception). OSTEOPOROSIS AND MENOPAUSE   Osteoporosis is a disease in which the bones lose minerals and strength with aging. This can result in serious bone fractures. Your risk for osteoporosis can be identified using a bone density scan.  If you are 65 years of age or older, or if you are at risk for osteoporosis and fractures, ask your health care provider if you should be screened.  Ask your health care provider whether you should take a calcium or vitamin D supplement to lower your risk for osteoporosis.  Menopause may have certain physical symptoms and risks.  Hormone replacement therapy may reduce some of these symptoms and risks. Talk to your health care provider about whether hormone replacement therapy is right for you.  HOME CARE INSTRUCTIONS   Schedule regular health, dental, and eye exams.  Stay current with your immunizations.   Do not use any tobacco products including cigarettes, chewing tobacco, or electronic cigarettes.  If you are pregnant, do not drink alcohol.  If you are breastfeeding, limit how much and how often you drink alcohol.  Limit alcohol intake to no more than 1 drink per day for nonpregnant women. One drink equals 12 ounces of beer, 5  ounces of wine, or 1 ounces of hard liquor.  Do not use street drugs.  Do not share needles.  Ask your health care provider for help if you need support or information about quitting drugs.  Tell your health care provider if you often feel depressed.  Tell your health care provider if you have ever been abused or do not feel safe at home.   This information is not intended to replace advice given to you by your health care provider. Make sure you discuss any questions you have with your health care provider.   Document Released: 07/10/2010 Document Revised: 01/15/2014 Document Reviewed: 11/26/2012 Elsevier Interactive Patient Education 2016 [employer] Inc[employer]  

## 2015-01-21 NOTE — Progress Notes (Signed)
   Subjective:    Patient ID: Jessica Richards, female    DOB: 10/20/1955, 60 y.o.   MRN: [id number]  HPI The patient is a 60 YO female coming in for wellness. Asthma severe and sees pulmonology. No new concerns.   PMH, [facility], social history reviewed and updated.   Review of Systems  Constitutional: Negative for fever, activity change, appetite change, fatigue and unexpected weight change.  HENT: Negative.   Eyes: Negative.   Respiratory: Negative for cough, chest tightness, shortness of breath and wheezing.   Cardiovascular: Negative for chest pain, palpitations and leg swelling.  Gastrointestinal: Negative for nausea, abdominal pain, diarrhea, constipation and abdominal distention.  Musculoskeletal: Positive for arthralgias.  Skin: Negative.   Neurological: Negative.   Psychiatric/Behavioral: Negative.       Objective:   Physical Exam  Constitutional: She is oriented to person, place, and time. She appears well-developed and well-nourished.  HENT:  Head: Normocephalic and atraumatic.  Eyes: EOM are normal.  Neck: Normal range of motion.  Cardiovascular: Normal rate and regular rhythm.   Pulmonary/Chest: Effort normal and breath sounds normal. No respiratory distress. She has no wheezes. She has no rales.  Abdominal: Soft. Bowel sounds are normal. She exhibits no distension. There is no tenderness. There is no rebound.  Musculoskeletal: She exhibits no edema.  Neurological: She is alert and oriented to person, place, and time. Coordination normal.  Skin: Skin is warm and dry.  Psychiatric: She has a normal mood and affect.   Filed Vitals:   01/21/15 0845  BP: 102/70  Pulse: 55  Temp: 98.2 F (36.8 C)  TempSrc: Oral  Resp: 16  Height: 5' 2.5" (1.588 m)  Weight: 141 lb 1.9 oz (64.012 kg)  SpO2: 98%      Assessment & Plan:

## 2015-02-28 ENCOUNTER — Other Ambulatory Visit: Payer: Self-pay | Admitting: Internal Medicine

## 2015-02-28 MED ORDER — TIOTROPIUM BROMIDE MONOHYDRATE 18 MCG IN CAPS: ORAL_CAPSULE | RESPIRATORY_TRACT | Status: DC

## 2015-07-15 ENCOUNTER — Other Ambulatory Visit: Payer: Self-pay | Admitting: Internal Medicine

## 2015-07-16 ENCOUNTER — Other Ambulatory Visit: Payer: Self-pay | Admitting: Internal Medicine

## 2015-10-04 ENCOUNTER — Other Ambulatory Visit: Payer: Self-pay | Admitting: Internal Medicine

## 2015-12-29 ENCOUNTER — Ambulatory Visit (INDEPENDENT_AMBULATORY_CARE_PROVIDER_SITE_OTHER): Payer: BLUE CROSS/BLUE SHIELD | Admitting: Internal Medicine

## 2015-12-29 ENCOUNTER — Encounter: Payer: Self-pay | Admitting: Internal Medicine

## 2015-12-29 DIAGNOSIS — J4540 Moderate persistent asthma, uncomplicated: Secondary | ICD-10-CM

## 2015-12-29 MED ORDER — GLYCOPYRROLATE-FORMOTEROL 9-4.8 MCG/ACT IN AERO: 2.0000 | INHALATION_SPRAY | Freq: Two times a day (BID) | RESPIRATORY_TRACT | 3 refills | Status: DC

## 2015-12-29 MED ORDER — GLYCOPYRROLATE-FORMOTEROL 9-4.8 MCG/ACT IN AERO: 2.0000 | INHALATION_SPRAY | Freq: Two times a day (BID) | RESPIRATORY_TRACT | 0 refills | Status: DC

## 2015-12-29 MED ORDER — THEOPHYLLINE ER 400 MG PO TB24: 400.0000 mg | ORAL_TABLET | Freq: Every day | ORAL | 3 refills | Status: DC

## 2015-12-29 MED ORDER — ALBUTEROL SULFATE HFA 108 (90 BASE) MCG/ACT IN AERS: INHALATION_SPRAY | RESPIRATORY_TRACT | 3 refills | Status: DC

## 2015-12-29 MED ORDER — ALBUTEROL SULFATE (2.5 MG/3ML) 0.083% IN NEBU: INHALATION_SOLUTION | RESPIRATORY_TRACT | 3 refills | Status: DC

## 2015-12-29 NOTE — Progress Notes (Signed)
HPI F former smoker followed for allergic rhinitis, asthma 6MWT 01/22/11-99%, 99%, 97%, 456 meters Not limited by oxygenation -CY PFT 01/22/2011-mild obstructive airways disease a slight response to bronchodilator, air trapping, normal diffusion. FEV1 2.18/95%, FEV1/FVC 0.65, FEF 25-75% 1.19/44%. RV 120%, DLCO 91%  ---------------------------------------------------------------------------   12/27/2014-60 year old female former smoker( 3ppd x 45 pack yrs) followed for allergic rhinitis, asthma FOLLOWS FOR. Pt states breathing has improved since last visit. Some SOB climbing stairs. Denies wheezing or cough FOLLOWS FOR:Pt state her breathing has been doing good being on Theo. Pt is due for neck surgery in January 2018. CXR 12/24/2013 IMPRESSION: COPD. There is no active cardiopulmonary disease. Electronically Signed  By: David SwazilandJordan  On: 12/24/2013 11:33  [birth year]58 year old female former smoker followed for allergic rhinitis, asthma/COPD FOLLOWS FOR:Pt state her breathing has been doing good being on Theo. Pt is due for neck surgery in January 2018. Has felt breathing much better controlled. No longer runs. [facility] and I suggested we try a LABA/LAMA to replace both Dulera and Spiriva. CXR 12/27/2014 IMPRESSION: 1. Stable mildly hyperinflated lungs, indicating a nonspecific obstructive lung disease. 2. Minimal scarring versus atelectasis at the right lung base. Otherwise no active cardiopulmonary disease.  ROS-see HPI Constitutional:   No-   weight loss, night sweats, fevers, chills, fatigue, lassitude. HEENT:   No-  headaches, difficulty swallowing, tooth/dental problems, sore throat,       No-  sneezing, itching, ear ache, nasal congestion, post nasal drip,  CV:  No-   chest pain, orthopnea, PND, swelling in lower extremities, anasarca, dizziness, palpitations Resp: +shortness of breath with exertion .              No-   productive cough,  No-  non-productive cough,  No- coughing up of blood.              No-   change in color of mucus.  No- wheezing.   Skin: No-   rash or lesions. GI:  No-   heartburn, indigestion, abdominal pain, nausea, vomiting,  GU:  MS:  No-   joint pain or swelling.  Neuro-     nothing unusual Psych:  No- change in mood or affect. No depression or anxiety.  No memory loss.  OBJ General- Alert, Oriented, Affect-appropriate, Distress- none acute, trim Skin- rash-none, lesions- none, excoriation- none Lymphadenopathy- none Head- atraumatic            Eyes- Gross vision intact, PERRLA, conjunctivae clear secretions            Ears- Hearing, canals-normal            Nose- Clear, no-Septal dev, mucus, polyps, erosion, perforation             Throat- Mallampati II , mucosa clear , drainage- none, tonsils- atrophic Neck- flexible , trachea midline, no stridor , thyroid nl, carotid no bruit Chest - symmetrical excursion , unlabored           Heart/CV- RRR , no murmur , no gallop  , no rub, nl s1 s2                           - JVD- none , edema- none, stasis changes- none, varices- none           Lung- Unlabored, wheeze -none, cough- none , dullness-none, rub- none           Chest wall-  Abd-  Br/  Gen/ Rectal- Not done, not indicated Extrem- cyanosis- none, clubbing, none, atrophy- none, strength- nl Neuro- grossly intact to observation

## 2015-12-29 NOTE — Patient Instructions (Signed)
Meds refill sent              Sample Bevespi inhaler    Inhale 2 puffs, twice daily    This replaces Dulera and Spriva  Please call if we can help

## 2016-01-09 NOTE — Assessment & Plan Note (Signed)
She has been using Dulera plus Spiriva. Not sure how important the steroid component is. Plan- Try Eyvonne LeftBevespi

## 2016-01-20 ENCOUNTER — Ambulatory Visit: Payer: Self-pay | Admitting: Physician Assistant

## 2016-01-23 ENCOUNTER — Other Ambulatory Visit (INDEPENDENT_AMBULATORY_CARE_PROVIDER_SITE_OTHER): Payer: BLUE CROSS/BLUE SHIELD

## 2016-01-23 ENCOUNTER — Telehealth: Payer: Self-pay | Admitting: Internal Medicine

## 2016-01-23 ENCOUNTER — Encounter: Payer: Self-pay | Admitting: Internal Medicine

## 2016-01-23 ENCOUNTER — Ambulatory Visit (INDEPENDENT_AMBULATORY_CARE_PROVIDER_SITE_OTHER): Payer: BLUE CROSS/BLUE SHIELD | Admitting: Internal Medicine

## 2016-01-23 VITALS — BP 124/78 | HR 63 | Temp 98.0°F | Resp 14 | Ht 62.5 in | Wt 137.0 lb

## 2016-01-23 DIAGNOSIS — J4540 Moderate persistent asthma, uncomplicated: Secondary | ICD-10-CM | POA: Diagnosis not present

## 2016-01-23 DIAGNOSIS — Z01810 Encounter for preprocedural cardiovascular examination: Secondary | ICD-10-CM | POA: Diagnosis not present

## 2016-01-23 DIAGNOSIS — Z0000 Encounter for general adult medical examination without abnormal findings: Secondary | ICD-10-CM | POA: Diagnosis not present

## 2016-01-23 DIAGNOSIS — Z23 Encounter for immunization: Secondary | ICD-10-CM

## 2016-01-23 LAB — COMPREHENSIVE METABOLIC PANEL
ALK PHOS: 55 U/L (ref 39–117)
ALT: 20 U/L (ref 0–35)
AST: 21 U/L (ref 0–37)
Albumin: 4.2 g/dL (ref 3.5–5.2)
BUN: 15 mg/dL (ref 6–23)
CO2: 26 mEq/L (ref 19–32)
Calcium: 9.5 mg/dL (ref 8.4–10.5)
Chloride: 106 mEq/L (ref 96–112)
Creatinine, Ser: 0.74 mg/dL (ref 0.40–1.20)
GFR: 85.03 mL/min (ref >60.00)
GLUCOSE: 90 mg/dL (ref 70–99)
POTASSIUM: 4.5 meq/L (ref 3.5–5.1)
Sodium: 139 mEq/L (ref 135–145)
TOTAL PROTEIN: 6.7 g/dL (ref 6.0–8.3)
Total Bilirubin: 0.3 mg/dL (ref 0.2–1.2)

## 2016-01-23 LAB — CBC
HCT: 36.7 % (ref 36.0–46.0)
HEMOGLOBIN: 12.2 g/dL (ref 12.0–15.0)
MCHC: 33.4 g/dL (ref 30.0–36.0)
MCV: 77.3 fl — ABNORMAL LOW (ref 78.0–100.0)
PLATELETS: 277 10*3/uL (ref 150.0–400.0)
RBC: 4.74 Mil/uL (ref 3.87–5.11)
RDW: 13.9 % (ref 11.5–15.5)
WBC: 8.1 10*3/uL (ref 4.0–10.5)

## 2016-01-23 LAB — LIPID PANEL
Cholesterol: 177 mg/dL (ref 0–200)
HDL: 89.7 mg/dL (ref >39.00)
LDL Cholesterol: 74 mg/dL (ref 0–99)
NonHDL: 86.86
Total CHOL/HDL Ratio: 2
Triglycerides: 62 mg/dL (ref 0.0–149.0)
VLDL: 12.4 mg/dL (ref 0.0–40.0)

## 2016-01-23 MED ORDER — BUDESONIDE-FORMOTEROL FUMARATE 160-4.5 MCG/ACT IN AERO: 2.0000 | INHALATION_SPRAY | Freq: Two times a day (BID) | RESPIRATORY_TRACT | 0 refills | Status: DC

## 2016-01-23 MED ORDER — TIOTROPIUM BROMIDE MONOHYDRATE 18 MCG IN CAPS: 18.0000 ug | ORAL_CAPSULE | Freq: Every day | RESPIRATORY_TRACT | 3 refills | Status: DC

## 2016-01-23 MED ORDER — PREDNISONE 20 MG PO TABS: 20.0000 mg | ORAL_TABLET | Freq: Every day | ORAL | 0 refills | Status: DC

## 2016-01-23 MED ORDER — BUDESONIDE-FORMOTEROL FUMARATE 160-4.5 MCG/ACT IN AERO: 2.0000 | INHALATION_SPRAY | Freq: Two times a day (BID) | RESPIRATORY_TRACT | 3 refills | Status: DC

## 2016-01-23 NOTE — Telephone Encounter (Signed)
Ok to d/c Bevespi  Change to Symbicort 160  # 1 ( or 3 month)   Refill x 1 year    Inhale 2 puffs then rinse mouth, twice daily                    Ok to refill and continue her previous Spiriva x 1 year                     If she thinks she needs, she can have prednisone 20 mg, # 5, 1 daily   to regain airway control

## 2016-01-23 NOTE — Telephone Encounter (Signed)
Spoke with pt, who states at her 12-29-15 OV with CY. Pt was started on bevespi in placed of sprivia and dulera. Pt state her breathing has worsen since switching medications. Pt c/o increased chest tightness & non prod cough. Pt states she is having to increase her usage of proair and neb treatments.  Pt states she is using her proair 3-4X daily and doing neb treatments 1-2 X daily.   CY please advise. Thanks.  Current Outpatient Prescriptions on File Prior to Visit  Medication Sig Dispense Refill  . albuterol (PROAIR HFA) 108 (90 Base) MCG/ACT inhaler USE 2 INHALATIONS ORALLY   INTO THE LUNGS EVERY 6     HOURS AS NEEDEDFOR        WHEEZING 25.5 g 3  . albuterol (PROVENTIL) (2.5 MG/3ML) 0.083% nebulizer solution USE 1 VIAL IN NEBULIZER 4 TIMES A DAY AS NEEDED 225 mL 3  . estrogen, conjugated,-medroxyprogesterone (PREMPRO) 0.3-1.5 MG tablet Take 1 tablet by mouth daily.    . Glycopyrrolate-Formoterol (BEVESPI AEROSPHERE) 9-4.8 MCG/ACT AERO Inhale 2 puffs into the lungs 2 (two) times daily. 3 Inhaler 3  . Glycopyrrolate-Formoterol (BEVESPI AEROSPHERE) 9-4.8 MCG/ACT AERO Inhale 2 puffs into the lungs 2 (two) times daily. 1 Inhaler 0  . ibandronate (BONIVA) 150 MG tablet Take 1 tablet once a month    . Multiple Vitamin (MULITIVITAMIN WITH MINERALS) TABS Take 2 tablets by mouth daily.      . theophylline (UNIPHYL) 400 MG 24 hr tablet Take 1 tablet (400 mg total) by mouth daily. 90 tablet 3   No current facility-administered medications on file prior to visit.     Allergies  Allergen Reactions  . Voltaren [Diclofenac]     rash

## 2016-01-23 NOTE — Telephone Encounter (Signed)
Patient is returning phone call.  °

## 2016-01-23 NOTE — Progress Notes (Signed)
Pre visit review using our clinic review tool, if applicable. No additional management support is needed unless otherwise documented below in the visit note. 

## 2016-01-23 NOTE — Telephone Encounter (Signed)
Pt aware of CY's recommendations & voiced her understanding. Rx sent to preferred pharmacy. Nothing further needed.  

## 2016-01-23 NOTE — Progress Notes (Signed)
   Subjective:    Patient ID: Jessica Richards, female    DOB: 10/20/1955, 61 y.o.   MRN: [id number]  HPI The patient is a 61 YO female coming in for wellness. She does have a few concerns which we are able to address. She needs surgical clearance for upcoming procedure. She is able to walk up 2 flights of stairs without stopping. She denies any chest pains or SOB.   PM[facility], [facility], social history reviewed and updated.   Review of Systems  Constitutional: Negative.   HENT: Negative.   Eyes: Negative.   Respiratory: Negative for cough, chest tightness and shortness of breath.   Cardiovascular: Negative for chest pain, palpitations and leg swelling.  Gastrointestinal: Negative for abdominal distention, abdominal pain, constipation, diarrhea, nausea and vomiting.  Musculoskeletal: Negative.   Skin: Negative.   Neurological: Negative.   Psychiatric/Behavioral: Negative.       Objective:   Physical Exam  Constitutional: She is oriented to person, place, and time. She appears well-developed and well-nourished.  HENT:  Head: Normocephalic and atraumatic.  Eyes: EOM are normal.  Neck: Normal range of motion.  Cardiovascular: Normal rate and regular rhythm.   Pulmonary/Chest: Effort normal and breath sounds normal. No respiratory distress. She has no wheezes. She has no rales.  Abdominal: Soft. Bowel sounds are normal. She exhibits no distension. There is no tenderness. There is no rebound.  Musculoskeletal: She exhibits no edema.  Neurological: She is alert and oriented to person, place, and time. Coordination normal.  Skin: Skin is warm and dry.  Psychiatric: She has a normal mood and affect.   Vitals:   01/23/16 1519  BP: 124/78  Pulse: 63  Resp: 14  Temp: 98 F (36.7 C)  TempSrc: Oral  SpO2: 98%  Weight: 137 lb (62.1 kg)  Height: 5' 2.5" (1.588 m)   EKG: Rate 59, sinus, axis normal, intervals normal, rare PAC, no st or t wave changes. No prior in records to compare.       Assessment & Plan:

## 2016-01-23 NOTE — Patient Instructions (Signed)
We have done the EKG today and will check the labs.   We will fill out the form for surgery and get it back to them.   We will ask Dr. Annamaria Boots about switching meds for the breathing because I think you need the steroid back in the inhaler.    Health Maintenance, Female Introduction Adopting a healthy lifestyle and getting preventive care can go a long way to promote health and wellness. Talk with your health care provider about what schedule of regular examinations is right for you. This is a good chance for you to check in with your provider about disease prevention and staying healthy. In between checkups, there are plenty of things you can do on your own. Experts have done a lot of research about which lifestyle changes and preventive measures are most likely to keep you healthy. Ask your health care provider for more information. Weight and diet Eat a healthy diet  Be sure to include plenty of vegetables, fruits, low-fat dairy products, and lean protein.  Do not eat a lot of foods high in solid fats, added sugars, or salt.  Get regular exercise. This is one of the most important things you can do for your health.  Most adults should exercise for at least 150 minutes each week. The exercise should increase your heart rate and make you sweat (moderate-intensity exercise).  Most adults should also do strengthening exercises at least twice a week. This is in addition to the moderate-intensity exercise. Maintain a healthy weight  Body mass index (BMI) is a measurement that can be used to identify possible weight problems. It estimates body fat based on height and weight. Your health care provider can help determine your BMI and help you achieve or maintain a healthy weight.  For females 3 years of age and older:  A BMI below 18.5 is considered underweight.  A BMI of 18.5 to 24.9 is normal.  A BMI of 25 to 29.9 is considered overweight.  A BMI of 30 and above is considered  obese. Watch levels of cholesterol and blood lipids  You should start having your blood tested for lipids and cholesterol at 61 years of age, then have this test every 5 years.  You may need to have your cholesterol levels checked more often if:  Your lipid or cholesterol levels are high.  You are older than 61 years of age.  You are at high risk for heart disease. Cancer screening Lung Cancer  Lung cancer screening is recommended for adults 79-15 years old who are at high risk for lung cancer because of a history of smoking.  A yearly low-dose CT scan of the lungs is recommended for people who:  Currently smoke.  Have quit within the past 15 years.  Have at least a 30-pack-year history of smoking. A pack year is smoking an average of one pack of cigarettes a day for 1 year.  Yearly screening should continue until it has been 15 years since you quit.  Yearly screening should stop if you develop a health problem that would prevent you from having lung cancer treatment. Breast Cancer  Practice breast self-awareness. This means understanding how your breasts normally appear and feel.  It also means doing regular breast self-exams. Let your health care provider know about any changes, no matter how small.  If you are in your 20s or 30s, you should have a clinical breast exam (CBE) by a health care provider every 1-3 years as part of  a regular health exam.  If you are 23 or older, have a CBE every year. Also consider having a breast X-ray (mammogram) every year.  If you have a family history of breast cancer, talk to your health care provider about genetic screening.  If you are at high risk for breast cancer, talk to your health care provider about having an MRI and a mammogram every year.  Breast cancer gene (BRCA) assessment is recommended for women who have family members with BRCA-related cancers. BRCA-related cancers include:  Breast.  Ovarian.  Tubal.  Peritoneal  cancers.  Results of the assessment will determine the need for genetic counseling and BRCA1 and BRCA2 testing. Cervical Cancer  Your health care provider may recommend that you be screened regularly for cancer of the pelvic organs (ovaries, uterus, and vagina). This screening involves a pelvic examination, including checking for microscopic changes to the surface of your cervix (Pap test). You may be encouraged to have this screening done every 3 years, beginning at age 25.  For women ages 40-65, health care providers may recommend pelvic exams and Pap testing every 3 years, or they may recommend the Pap and pelvic exam, combined with testing for human papilloma virus (HPV), every 5 years. Some types of HPV increase your risk of cervical cancer. Testing for HPV may also be done on women of any age with unclear Pap test results.  Other health care providers may not recommend any screening for nonpregnant women who are considered low risk for pelvic cancer and who do not have symptoms. Ask your health care provider if a screening pelvic exam is right for you.  If you have had past treatment for cervical cancer or a condition that could lead to cancer, you need Pap tests and screening for cancer for at least 20 years after your treatment. If Pap tests have been discontinued, your risk factors (such as having a new sexual partner) need to be reassessed to determine if screening should resume. Some women have medical problems that increase the chance of getting cervical cancer. In these cases, your health care provider may recommend more frequent screening and Pap tests. Colorectal Cancer  This type of cancer can be detected and often prevented.  Routine colorectal cancer screening usually begins at 61 years of age and continues through 61 years of age.  Your health care provider may recommend screening at an earlier age if you have risk factors for colon cancer.  Your health care provider may also  recommend using home test kits to check for hidden blood in the stool.  A small camera at the end of a tube can be used to examine your colon directly (sigmoidoscopy or colonoscopy). This is done to check for the earliest forms of colorectal cancer.  Routine screening usually begins at age 57.  Direct examination of the colon should be repeated every 5-10 years through 61 years of age. However, you may need to be screened more often if early forms of precancerous polyps or small growths are found. Skin Cancer  Check your skin from head to toe regularly.  Tell your health care provider about any new moles or changes in moles, especially if there is a change in a mole's shape or color.  Also tell your health care provider if you have a mole that is larger than the size of a pencil eraser.  Always use sunscreen. Apply sunscreen liberally and repeatedly throughout the day.  Protect yourself by wearing long sleeves, pants, a  wide-brimmed hat, and sunglasses whenever you are outside. Heart disease, diabetes, and high blood pressure  High blood pressure causes heart disease and increases the risk of stroke. High blood pressure is more likely to develop in:  People who have blood pressure in the high end of the normal range (130-139/85-89 mm Hg).  People who are overweight or obese.  People who are African American.  If you are 14-56 years of age, have your blood pressure checked every 3-5 years. If you are 58 years of age or older, have your blood pressure checked every year. You should have your blood pressure measured twice-once when you are at a hospital or clinic, and once when you are not at a hospital or clinic. Record the average of the two measurements. To check your blood pressure when you are not at a hospital or clinic, you can use:  An automated blood pressure machine at a pharmacy.  A home blood pressure monitor.  If you are between 79 years and 61 years old, ask your health  care provider if you should take aspirin to prevent strokes.  Have regular diabetes screenings. This involves taking a blood sample to check your fasting blood sugar level.  If you are at a normal weight and have a low risk for diabetes, have this test once every three years after 61 years of age.  If you are overweight and have a high risk for diabetes, consider being tested at a younger age or more often. Preventing infection Hepatitis B  If you have a higher risk for hepatitis B, you should be screened for this virus. You are considered at high risk for hepatitis B if:  You were born in a country where hepatitis B is common. Ask your health care provider which countries are considered high risk.  Your parents were born in a high-risk country, and you have not been immunized against hepatitis B (hepatitis B vaccine).  You have HIV or AIDS.  You use needles to inject street drugs.  You live with someone who has hepatitis B.  You have had sex with someone who has hepatitis B.  You get hemodialysis treatment.  You take certain medicines for conditions, including cancer, organ transplantation, and autoimmune conditions. Hepatitis C  Blood testing is recommended for:  Everyone born from [birth year] through 1965.  Anyone with known risk factors for hepatitis C. Sexually transmitted infections (STIs)  You should be screened for sexually transmitted infections (STIs) including gonorrhea and chlamydia if:  You are sexually active and are younger than 61 years of age.  You are older than 61 years of age and your health care provider tells you that you are at risk for this type of infection.  Your sexual activity has changed since you were last screened and you are at an increased risk for chlamydia or gonorrhea. Ask your health care provider if you are at risk.  If you do not have HIV, but are at risk, it may be recommended that you take a prescription medicine daily to prevent HIV  infection. This is called pre-exposure prophylaxis (PrEP). You are considered at risk if:  You are sexually active and do not regularly use condoms or know the HIV status of your partner(s).  You take drugs by injection.  You are sexually active with a partner who has HIV. Talk with your health care provider about whether you are at high risk of being infected with HIV. If you choose to begin PrEP, you should  first be tested for HIV. You should then be tested every 3 months for as long as you are taking PrEP. Pregnancy  If you are premenopausal and you may become pregnant, ask your health care provider about preconception counseling.  If you may become pregnant, take 400 to 800 micrograms (mcg) of folic acid every day.  If you want to prevent pregnancy, talk to your health care provider about birth control (contraception). Osteoporosis and menopause  Osteoporosis is a disease in which the bones lose minerals and strength with aging. This can result in serious bone fractures. Your risk for osteoporosis can be identified using a bone density scan.  If you are 64 years of age or older, or if you are at risk for osteoporosis and fractures, ask your health care provider if you should be screened.  Ask your health care provider whether you should take a calcium or vitamin D supplement to lower your risk for osteoporosis.  Menopause may have certain physical symptoms and risks.  Hormone replacement therapy may reduce some of these symptoms and risks. Talk to your health care provider about whether hormone replacement therapy is right for you. Follow these instructions at home:  Schedule regular health, dental, and eye exams.  Stay current with your immunizations.  Do not use any tobacco products including cigarettes, chewing tobacco, or electronic cigarettes.  If you are pregnant, do not drink alcohol.  If you are breastfeeding, limit how much and how often you drink alcohol.  Limit  alcohol intake to no more than 1 drink per day for nonpregnant women. One drink equals 12 ounces of beer, 5 ounces of wine, or 1 ounces of hard liquor.  Do not use street drugs.  Do not share needles.  Ask your health care provider for help if you need support or information about quitting drugs.  Tell your health care provider if you often feel depressed.  Tell your health care provider if you have ever been abused or do not feel safe at home. This information is not intended to replace advice given to you by your health care provider. Make sure you discuss any questions you have with your health care provider. Document Released: 07/10/2010 Document Revised: 06/02/2015 Document Reviewed: 09/28/2014  2017 Elsevier

## 2016-01-23 NOTE — Telephone Encounter (Signed)
lmtcb X1 

## 2016-01-27 DIAGNOSIS — Z01810 Encounter for preprocedural cardiovascular examination: Secondary | ICD-10-CM | POA: Insufficient documentation

## 2016-01-27 NOTE — Assessment & Plan Note (Signed)
She has not done well with her new regimen of inhaler and she is waiting to hear back from her lung specialist on changing. She had been on dulera which is not covered any longer on her insurance.

## 2016-01-27 NOTE — Assessment & Plan Note (Signed)
Cleared with low risk for her neck surgery which is elective. Checking labs.

## 2016-01-27 NOTE — Assessment & Plan Note (Signed)
Checking labs, flu shot and tetanus are up to date. Declines shingles today. Cleared for upcoming surgery. Colonoscopy due next year. Counseled on sun safety and the dangers of distracted driving. Given screening recommendations.

## 2016-02-15 ENCOUNTER — Encounter (HOSPITAL_COMMUNITY)
Admission: RE | Admit: 2016-02-15 | Discharge: 2016-02-15 | Disposition: A | Discharge status: Home / Self Care | Payer: BLUE CROSS/BLUE SHIELD | Source: Ambulatory Visit | Attending: Orthopedic Surgery | Admitting: Orthopedic Surgery

## 2016-02-15 ENCOUNTER — Encounter (HOSPITAL_COMMUNITY): Payer: Self-pay

## 2016-02-15 ENCOUNTER — Other Ambulatory Visit (HOSPITAL_COMMUNITY): Payer: Self-pay | Admitting: *Deleted

## 2016-02-15 DIAGNOSIS — Z01812 Encounter for preprocedural laboratory examination: Secondary | ICD-10-CM | POA: Diagnosis not present

## 2016-02-15 HISTORY — DX: Unspecified osteoarthritis, unspecified site: M19.90

## 2016-02-15 HISTORY — DX: Anxiety disorder, unspecified: F41.9

## 2016-02-15 HISTORY — DX: Dyspnea, unspecified: R06.00

## 2016-02-15 HISTORY — DX: Headache, unspecified: R51.9

## 2016-02-15 HISTORY — DX: Headache: R51

## 2016-02-15 HISTORY — DX: Cardiac murmur, unspecified: R01.1

## 2016-02-15 LAB — CBC
HEMATOCRIT: 38.4 % (ref 36.0–46.0)
Hemoglobin: 12 g/dL (ref 12.0–15.0)
MCH: 25.3 pg — ABNORMAL LOW (ref 26.0–34.0)
MCHC: 31.3 g/dL (ref 30.0–36.0)
MCV: 80.8 fL (ref 78.0–100.0)
PLATELETS: 233 10*3/uL (ref 150–400)
RBC: 4.75 MIL/uL (ref 3.87–5.11)
RDW: 14.3 % (ref 11.5–15.5)
WBC: 5.5 10*3/uL (ref 4.0–10.5)

## 2016-02-15 LAB — BASIC METABOLIC PANEL
Anion gap: 7 (ref 5–15)
BUN: 14 mg/dL (ref 6–20)
CALCIUM: 9.2 mg/dL (ref 8.9–10.3)
CO2: 23 mmol/L (ref 22–32)
CREATININE: 0.77 mg/dL (ref 0.44–1.00)
Chloride: 110 mmol/L (ref 101–111)
GFR calc Af Amer: >60 mL/min (ref >60)
GFR calc non Af Amer: >60 mL/min (ref >60)
GLUCOSE: 89 mg/dL (ref 65–99)
POTASSIUM: 4.1 mmol/L (ref 3.5–5.1)
SODIUM: 140 mmol/L (ref 135–145)

## 2016-02-15 LAB — SURGICAL PCR SCREEN
MRSA, PCR: NEGATIVE
STAPHYLOCOCCUS AUREUS: NEGATIVE

## 2016-02-15 NOTE — Pre-Procedure Instructions (Signed)
    Candyce ChurnSharon R Bogle  02/15/2016      [facility] [place] - [id number] AT [facility] [address] [place] [id number] Phone: [phone] Fax: [phone]  [id number] [place] [place] - [address] AT [address] [place] [id number] Phone: [phone] Fax: [phone]    Your procedure is scheduled on 02-23-2016   Thursday .  [facility] at [facility]:30 [facility].M.   Call this number if you have problems the morning of surgery:  [phone]   Remember:  Do not eat food or drink liquids after midnight.   Take these medicines the morning of surgery with A SIP OF WATER use all inhalers ,nebulizers as orders and needed,Prempro,theophyline           STOP ASPIRIN,ANTIINFLAMATORIES (IBUPROFEN,ALEVE,MOTRIN,ADVIL,GOODY'S POWDERS),HERBAL SUPPLEMENTS,FISH OIL,AND VITAMINS 5-7 DAYS PRIOR TO SURGERY   Do not wear jewelry, make-up or nail polish.  Do not wear lotions, powders, or perfumes, or deoderant.  Do not shave 48 hours prior to surgery.  Men may shave face and neck.   Do not bring valuables to the hospital.  [facility] is not responsible for any belongings or valuables.  Contacts, dentures or bridgework may not be worn into surgery.  Leave your suitcase in the car.  After surgery it may be brought to your room.  For patients admitted to the hospital, discharge time will be determined by your treatment team.  Patients discharged the day of surgery will not be allowed to drive home.    Special instructions:  See attached Sheet for instructions on CHG showers  .

## 2016-02-16 NOTE — Progress Notes (Signed)
Anesthesia Chart Review:  Pt is a 61 year old female scheduled for C5-6 ACDF on 02/23/2016 with Venita Lickahari Brooks, MD  - PCP is Hillard DankerElizabeth Crawford, MD who cleared pt for surgery at last office visit 01/23/16.  - Pulmonologist is Jetty Duhamellinton Young, MD, last office visit 12/29/15.   PMH includes:  Mild mitral regurgitation, asthma, COPD. Former smoker. BMI 25.5  Medications include: albuterol, symbicort, theophylline, spiriva.   Preoperative labs reviewed.    EKG 01/23/16: Sinus  Bradycardia (59 bpm). Frequent PACs   Echo 12/17/06:  - Overall left ventricular systolic function was normal. Left ventricular ejection fraction was estimated , range being 55% to 60 %. There were no left ventricular regional wall motion abnormalities. Features were consistent with mild diastolic dysfunction. - There was mild mitral valvular regurgitation. - Left atrial size was at the upper limits of normal. There was increased thickness of the interatrial septum, consistent with lipomatous hypertrophy.  If no changes, I anticipate pt can proceed with surgery as scheduled.   Rica Mastngela Sung [place], FNP-BC [facility] Phone: [phone] 02/16/2016 3:21 PM

## 2016-02-22 ENCOUNTER — Encounter (HOSPITAL_COMMUNITY): Payer: Self-pay | Admitting: Anesthesiology

## 2016-02-22 NOTE — Anesthesia Preprocedure Evaluation (Signed)
Anesthesia Evaluation  Patient identified by MRN, date of birth, ID band Patient awake    Reviewed: Allergy & Precautions, NPO status , Patient's Chart, lab work & pertinent test results  Airway Mallampati: II  TM Distance: >3 FB Neck ROM: Full    Dental no notable dental hx.    Pulmonary shortness of breath, asthma , COPD, former smoker,    Pulmonary exam normal breath sounds clear to auscultation       Cardiovascular negative cardio ROS Normal cardiovascular exam+ Valvular Problems/Murmurs  Rhythm:Regular Rate:Normal     Neuro/Psych  Headaches, PSYCHIATRIC DISORDERS Anxiety    GI/Hepatic negative GI ROS, Neg liver ROS,   Endo/Other  negative endocrine ROS  Renal/GU negative Renal ROS     Musculoskeletal  (+) Arthritis ,   Abdominal   Peds  Hematology negative hematology ROS (+)   Anesthesia Other Findings   Reproductive/Obstetrics negative OB ROS                             Anesthesia Physical Anesthesia Plan  ASA: III  Anesthesia Plan: General   Post-op Pain Management:    Induction: Intravenous  Airway Management Planned: Oral ETT  Additional Equipment:   Intra-op Plan:   Post-operative Plan: Extubation in OR  Informed Consent: I have reviewed the patients History and Physical, chart, labs and discussed the procedure including the risks, benefits and alternatives for the proposed anesthesia with the patient or authorized representative who has indicated his/her understanding and acceptance.   Dental advisory given  Plan Discussed with: CRNA  Anesthesia Plan Comments:         Anesthesia Quick Evaluation

## 2016-02-23 ENCOUNTER — Ambulatory Visit (HOSPITAL_COMMUNITY): Payer: BLUE CROSS/BLUE SHIELD

## 2016-02-23 ENCOUNTER — Ambulatory Visit (HOSPITAL_COMMUNITY): Payer: BLUE CROSS/BLUE SHIELD | Admitting: Emergency Medicine

## 2016-02-23 ENCOUNTER — Encounter (HOSPITAL_COMMUNITY)
Admission: RE | Disposition: A | Discharge status: Home / Self Care | Payer: Self-pay | Source: Ambulatory Visit | Attending: Orthopedic Surgery

## 2016-02-23 ENCOUNTER — Ambulatory Visit (HOSPITAL_COMMUNITY): Payer: BLUE CROSS/BLUE SHIELD | Admitting: Anesthesiology

## 2016-02-23 ENCOUNTER — Observation Stay (HOSPITAL_COMMUNITY): Payer: BLUE CROSS/BLUE SHIELD

## 2016-02-23 ENCOUNTER — Encounter (HOSPITAL_COMMUNITY): Payer: Self-pay | Admitting: *Deleted

## 2016-02-23 ENCOUNTER — Observation Stay (HOSPITAL_COMMUNITY)
Admission: RE | Admit: 2016-02-23 | Discharge: 2016-02-24 | Disposition: A | Discharge status: Home / Self Care | Payer: BLUE CROSS/BLUE SHIELD | Source: Ambulatory Visit | Attending: Orthopedic Surgery | Admitting: Orthopedic Surgery

## 2016-02-23 DIAGNOSIS — Z87891 Personal history of nicotine dependence: Secondary | ICD-10-CM | POA: Insufficient documentation

## 2016-02-23 DIAGNOSIS — Z7983 Long term (current) use of bisphosphonates: Secondary | ICD-10-CM | POA: Insufficient documentation

## 2016-02-23 DIAGNOSIS — Z419 Encounter for procedure for purposes other than remedying health state, unspecified: Secondary | ICD-10-CM

## 2016-02-23 DIAGNOSIS — Z79899 Other long term (current) drug therapy: Secondary | ICD-10-CM | POA: Insufficient documentation

## 2016-02-23 DIAGNOSIS — M4722 Other spondylosis with radiculopathy, cervical region: Secondary | ICD-10-CM | POA: Diagnosis not present

## 2016-02-23 DIAGNOSIS — M50122 Cervical disc disorder at C5-C6 level with radiculopathy: Principal | ICD-10-CM | POA: Insufficient documentation

## 2016-02-23 DIAGNOSIS — Z981 Arthrodesis status: Secondary | ICD-10-CM

## 2016-02-23 DIAGNOSIS — J449 Chronic obstructive pulmonary disease, unspecified: Secondary | ICD-10-CM | POA: Diagnosis not present

## 2016-02-23 DIAGNOSIS — M542 Cervicalgia: Secondary | ICD-10-CM | POA: Diagnosis present

## 2016-02-23 HISTORY — PX: ANTERIOR CERVICAL DECOMP/DISCECTOMY FUSION: SHX1161

## 2016-02-23 SURGERY — ANTERIOR CERVICAL DECOMPRESSION/DISCECTOMY FUSION 1 LEVEL
Anesthesia: General | Site: Spine Cervical

## 2016-02-23 MED ORDER — FENTANYL CITRATE (PF) 100 MCG/2ML IJ SOLN
INTRAMUSCULAR | Status: AC
  Filled 2016-02-23: qty 2

## 2016-02-23 MED ORDER — 0.9 % SODIUM CHLORIDE (POUR BTL) OPTIME
TOPICAL | Status: DC | PRN
  Administered 2016-02-23: 1000 mL

## 2016-02-23 MED ORDER — DEXAMETHASONE 4 MG PO TABS
4.0000 mg | ORAL_TABLET | Freq: Four times a day (QID) | ORAL | Status: DC
  Administered 2016-02-23 (×2): 4 mg via ORAL
  Filled 2016-02-23 (×3): qty 1

## 2016-02-23 MED ORDER — HYDROMORPHONE HCL 1 MG/ML IJ SOLN
INTRAMUSCULAR | Status: DC | PRN
  Administered 2016-02-23 (×2): 0.5 mg via INTRAVENOUS

## 2016-02-23 MED ORDER — METHOCARBAMOL 1000 MG/10ML IJ SOLN
500.0000 mg | Freq: Four times a day (QID) | INTRAVENOUS | Status: DC | PRN
  Administered 2016-02-23: 500 mg via INTRAVENOUS
  Filled 2016-02-23 (×3): qty 5

## 2016-02-23 MED ORDER — KETAMINE HCL-SODIUM CHLORIDE 100-0.9 MG/10ML-% IV SOSY
PREFILLED_SYRINGE | INTRAVENOUS | Status: AC
  Filled 2016-02-23: qty 10

## 2016-02-23 MED ORDER — SODIUM CHLORIDE 0.9% FLUSH
3.0000 mL | Freq: Two times a day (BID) | INTRAVENOUS | Status: DC
  Administered 2016-02-23: 3 mL via INTRAVENOUS

## 2016-02-23 MED ORDER — ONDANSETRON HCL 4 MG/2ML IJ SOLN
INTRAMUSCULAR | Status: DC | PRN
  Administered 2016-02-23: 4 mg via INTRAVENOUS

## 2016-02-23 MED ORDER — LIDOCAINE HCL (CARDIAC) 20 MG/ML IV SOLN
INTRAVENOUS | Status: DC | PRN
  Administered 2016-02-23: 100 mg via INTRATRACHEAL

## 2016-02-23 MED ORDER — ONDANSETRON HCL 4 MG/2ML IJ SOLN
INTRAMUSCULAR | Status: AC
  Filled 2016-02-23: qty 2

## 2016-02-23 MED ORDER — METHOCARBAMOL 500 MG PO TABS
500.0000 mg | ORAL_TABLET | Freq: Four times a day (QID) | ORAL | Status: DC | PRN
  Administered 2016-02-23: 500 mg via ORAL
  Filled 2016-02-23 (×2): qty 1

## 2016-02-23 MED ORDER — SODIUM CHLORIDE 0.9 % IV SOLN: 250.0000 mL | INTRAVENOUS | Status: DC

## 2016-02-23 MED ORDER — ALBUTEROL SULFATE (2.5 MG/3ML) 0.083% IN NEBU: 3.0000 mL | INHALATION_SOLUTION | Freq: Four times a day (QID) | RESPIRATORY_TRACT | Status: DC | PRN

## 2016-02-23 MED ORDER — ACETAMINOPHEN 10 MG/ML IV SOLN
INTRAVENOUS | Status: AC
  Filled 2016-02-23: qty 100

## 2016-02-23 MED ORDER — PHENYLEPHRINE HCL 10 MG/ML IJ SOLN
INTRAMUSCULAR | Status: DC | PRN
  Administered 2016-02-23: 50 ug/min via INTRAVENOUS

## 2016-02-23 MED ORDER — EPINEPHRINE PF 1 MG/ML IJ SOLN
INTRAMUSCULAR | Status: AC
  Filled 2016-02-23: qty 1

## 2016-02-23 MED ORDER — PROPOFOL 10 MG/ML IV BOLUS
INTRAVENOUS | Status: AC
  Filled 2016-02-23: qty 20

## 2016-02-23 MED ORDER — THEOPHYLLINE ER 400 MG PO TB24
400.0000 mg | ORAL_TABLET | Freq: Every day | ORAL | Status: DC
  Filled 2016-02-23: qty 1

## 2016-02-23 MED ORDER — HYDROMORPHONE HCL 1 MG/ML IJ SOLN
INTRAMUSCULAR | Status: AC
  Filled 2016-02-23: qty 1

## 2016-02-23 MED ORDER — MIDAZOLAM HCL 2 MG/2ML IJ SOLN
INTRAMUSCULAR | Status: AC
  Filled 2016-02-23: qty 2

## 2016-02-23 MED ORDER — MENTHOL 3 MG MT LOZG
1.0000 | LOZENGE | OROMUCOSAL | Status: DC | PRN
  Filled 2016-02-23: qty 9

## 2016-02-23 MED ORDER — DEXAMETHASONE SODIUM PHOSPHATE 4 MG/ML IJ SOLN: 4.0000 mg | Freq: Four times a day (QID) | INTRAMUSCULAR | Status: AC

## 2016-02-23 MED ORDER — ARTIFICIAL TEARS OP OINT
TOPICAL_OINTMENT | OPHTHALMIC | Status: DC | PRN
  Administered 2016-02-23: 1 via OPHTHALMIC

## 2016-02-23 MED ORDER — FENTANYL CITRATE (PF) 100 MCG/2ML IJ SOLN
INTRAMUSCULAR | Status: DC | PRN
  Administered 2016-02-23: 50 ug via INTRAVENOUS
  Administered 2016-02-23: 100 ug via INTRAVENOUS

## 2016-02-23 MED ORDER — OXYMETAZOLINE HCL 0.05 % NA SOLN
NASAL | Status: DC | PRN
  Administered 2016-02-23: 2 via NASAL

## 2016-02-23 MED ORDER — OXYCODONE HCL 5 MG PO TABS
5.0000 mg | ORAL_TABLET | ORAL | Status: DC | PRN
  Administered 2016-02-23 – 2016-02-24 (×3): 10 mg via ORAL
  Filled 2016-02-23 (×3): qty 2

## 2016-02-23 MED ORDER — METHOCARBAMOL 500 MG PO TABS
ORAL_TABLET | ORAL | Status: AC
  Filled 2016-02-23: qty 1

## 2016-02-23 MED ORDER — PROPOFOL 10 MG/ML IV BOLUS
INTRAVENOUS | Status: DC | PRN
  Administered 2016-02-23: 120 mg via INTRAVENOUS
  Administered 2016-02-23: 80 mg via INTRAVENOUS

## 2016-02-23 MED ORDER — TIOTROPIUM BROMIDE MONOHYDRATE 18 MCG IN CAPS
18.0000 ug | ORAL_CAPSULE | Freq: Every day | RESPIRATORY_TRACT | Status: DC
  Administered 2016-02-24: 18 ug via RESPIRATORY_TRACT
  Filled 2016-02-23: qty 5

## 2016-02-23 MED ORDER — ALBUTEROL SULFATE HFA 108 (90 BASE) MCG/ACT IN AERS
INHALATION_SPRAY | RESPIRATORY_TRACT | Status: DC | PRN
  Administered 2016-02-23 (×2): 2 via RESPIRATORY_TRACT

## 2016-02-23 MED ORDER — OXYCODONE-ACETAMINOPHEN 5-325 MG PO TABS: 1.0000 | ORAL_TABLET | ORAL | Status: DC | PRN

## 2016-02-23 MED ORDER — SUCCINYLCHOLINE CHLORIDE 200 MG/10ML IV SOSY
PREFILLED_SYRINGE | INTRAVENOUS | Status: AC
  Filled 2016-02-23: qty 10

## 2016-02-23 MED ORDER — DEXAMETHASONE SODIUM PHOSPHATE 10 MG/ML IJ SOLN
INTRAMUSCULAR | Status: AC
  Filled 2016-02-23: qty 1

## 2016-02-23 MED ORDER — CEFAZOLIN SODIUM-DEXTROSE 2-4 GM/100ML-% IV SOLN
2.0000 g | Freq: Three times a day (TID) | INTRAVENOUS | Status: AC
  Administered 2016-02-23 (×2): 2 g via INTRAVENOUS
  Filled 2016-02-23 (×2): qty 100

## 2016-02-23 MED ORDER — ROCURONIUM BROMIDE 50 MG/5ML IV SOSY
PREFILLED_SYRINGE | INTRAVENOUS | Status: AC
  Filled 2016-02-23: qty 5

## 2016-02-23 MED ORDER — MEPERIDINE HCL 25 MG/ML IJ SOLN: 6.2500 mg | INTRAMUSCULAR | Status: DC | PRN

## 2016-02-23 MED ORDER — DEXAMETHASONE SODIUM PHOSPHATE 4 MG/ML IJ SOLN: 4.0000 mg | Freq: Four times a day (QID) | INTRAMUSCULAR | Status: DC

## 2016-02-23 MED ORDER — OXYCODONE-ACETAMINOPHEN 10-325 MG PO TABS: 1.0000 | ORAL_TABLET | ORAL | 0 refills | Status: DC | PRN

## 2016-02-23 MED ORDER — HYDROMORPHONE HCL 1 MG/ML IJ SOLN
INTRAMUSCULAR | Status: AC
  Filled 2016-02-23: qty 1.5

## 2016-02-23 MED ORDER — ALBUTEROL SULFATE (2.5 MG/3ML) 0.083% IN NEBU: 2.5000 mg | INHALATION_SOLUTION | Freq: Four times a day (QID) | RESPIRATORY_TRACT | Status: DC | PRN

## 2016-02-23 MED ORDER — PROMETHAZINE HCL 25 MG/ML IJ SOLN
INTRAMUSCULAR | Status: AC
Start: 2016-02-23 — End: 2016-02-23
  Filled 2016-02-23: qty 1

## 2016-02-23 MED ORDER — BUPIVACAINE-EPINEPHRINE 0.25% -1:200000 IJ SOLN
INTRAMUSCULAR | Status: DC | PRN
  Administered 2016-02-23: 8 mL

## 2016-02-23 MED ORDER — METHOCARBAMOL 500 MG PO TABS: 500.0000 mg | ORAL_TABLET | Freq: Three times a day (TID) | ORAL | 0 refills | Status: DC | PRN

## 2016-02-23 MED ORDER — BUPIVACAINE HCL (PF) 0.25 % IJ SOLN
INTRAMUSCULAR | Status: AC
  Filled 2016-02-23: qty 30

## 2016-02-23 MED ORDER — ALBUTEROL SULFATE HFA 108 (90 BASE) MCG/ACT IN AERS
INHALATION_SPRAY | RESPIRATORY_TRACT | Status: AC
  Filled 2016-02-23: qty 6.7

## 2016-02-23 MED ORDER — ARTIFICIAL TEARS OP OINT
TOPICAL_OINTMENT | OPHTHALMIC | Status: AC
  Filled 2016-02-23: qty 3.5

## 2016-02-23 MED ORDER — ACETAMINOPHEN 650 MG RE SUPP: 650.0000 mg | RECTAL | Status: DC | PRN

## 2016-02-23 MED ORDER — LACTATED RINGERS IV SOLN
INTRAVENOUS | Status: DC | PRN
  Administered 2016-02-23 (×2): via INTRAVENOUS

## 2016-02-23 MED ORDER — DEXAMETHASONE 4 MG PO TABS
4.0000 mg | ORAL_TABLET | Freq: Four times a day (QID) | ORAL | Status: AC
  Administered 2016-02-24: 4 mg via ORAL
  Filled 2016-02-23: qty 1

## 2016-02-23 MED ORDER — ACETAMINOPHEN 10 MG/ML IV SOLN
INTRAVENOUS | Status: DC | PRN
  Administered 2016-02-23: 1000 mg via INTRAVENOUS

## 2016-02-23 MED ORDER — DEXAMETHASONE SODIUM PHOSPHATE 10 MG/ML IJ SOLN
INTRAMUSCULAR | Status: DC | PRN
  Administered 2016-02-23: 10 mg via INTRAVENOUS

## 2016-02-23 MED ORDER — HEMOSTATIC AGENTS (NO CHARGE) OPTIME
TOPICAL | Status: DC | PRN
  Administered 2016-02-23: 1 via TOPICAL

## 2016-02-23 MED ORDER — PROMETHAZINE HCL 25 MG/ML IJ SOLN
6.2500 mg | INTRAMUSCULAR | Status: DC | PRN
  Administered 2016-02-23: 6.25 mg via INTRAVENOUS

## 2016-02-23 MED ORDER — METOPROLOL TARTARATE 1 MG/ML SYRINGE (5ML)
Status: DC | PRN
  Administered 2016-02-23: 1 mg via INTRAVENOUS
  Administered 2016-02-23 (×2): 2 mg via INTRAVENOUS

## 2016-02-23 MED ORDER — PHENOL 1.4 % MT LIQD
1.0000 | OROMUCOSAL | Status: DC | PRN
  Filled 2016-02-23: qty 177

## 2016-02-23 MED ORDER — ONDANSETRON HCL 4 MG PO TABS: 4.0000 mg | ORAL_TABLET | Freq: Three times a day (TID) | ORAL | 0 refills | Status: DC | PRN

## 2016-02-23 MED ORDER — HYDROMORPHONE HCL 1 MG/ML IJ SOLN
0.2500 mg | INTRAMUSCULAR | Status: DC | PRN
  Administered 2016-02-23: 0.5 mg via INTRAVENOUS
  Administered 2016-02-23: 0.25 mg via INTRAVENOUS

## 2016-02-23 MED ORDER — CONJ ESTROG-MEDROXYPROGEST ACE 0.3-1.5 MG PO TABS: 1.0000 | ORAL_TABLET | Freq: Every day | ORAL | Status: DC

## 2016-02-23 MED ORDER — METOPROLOL TARTRATE 5 MG/5ML IV SOLN
INTRAVENOUS | Status: AC
  Filled 2016-02-23: qty 5

## 2016-02-23 MED ORDER — CEFAZOLIN SODIUM-DEXTROSE 2-4 GM/100ML-% IV SOLN
2.0000 g | INTRAVENOUS | Status: AC
  Administered 2016-02-23: 2 g via INTRAVENOUS

## 2016-02-23 MED ORDER — LACTATED RINGERS IV SOLN
INTRAVENOUS | Status: DC
  Administered 2016-02-23: 11:00:00 via INTRAVENOUS

## 2016-02-23 MED ORDER — ACETAMINOPHEN 325 MG PO TABS: 650.0000 mg | ORAL_TABLET | ORAL | Status: DC | PRN

## 2016-02-23 MED ORDER — ONDANSETRON HCL 4 MG/2ML IJ SOLN
4.0000 mg | INTRAMUSCULAR | Status: DC | PRN
  Administered 2016-02-23: 4 mg via INTRAVENOUS
  Filled 2016-02-23: qty 2

## 2016-02-23 MED ORDER — ROCURONIUM BROMIDE 100 MG/10ML IV SOLN
INTRAVENOUS | Status: DC | PRN
  Administered 2016-02-23: 20 mg via INTRAVENOUS
  Administered 2016-02-23: 40 mg via INTRAVENOUS

## 2016-02-23 MED ORDER — LIDOCAINE 2% (20 MG/ML) 5 ML SYRINGE
INTRAMUSCULAR | Status: AC
  Filled 2016-02-23: qty 5

## 2016-02-23 MED ORDER — SODIUM CHLORIDE 0.9% FLUSH: 3.0000 mL | INTRAVENOUS | Status: DC | PRN

## 2016-02-23 MED ORDER — GLYCOPYRROLATE 0.2 MG/ML IJ SOLN
INTRAMUSCULAR | Status: DC | PRN
  Administered 2016-02-23: 0.4 mg via INTRAVENOUS
  Administered 2016-02-23 (×2): 0.1 mg via INTRAVENOUS

## 2016-02-23 MED ORDER — MIDAZOLAM HCL 2 MG/2ML IJ SOLN
INTRAMUSCULAR | Status: DC | PRN
  Administered 2016-02-23 (×2): 1 mg via INTRAVENOUS

## 2016-02-23 MED ORDER — KETAMINE HCL 10 MG/ML IJ SOLN
INTRAMUSCULAR | Status: DC | PRN
  Administered 2016-02-23: 20 mg via INTRAVENOUS

## 2016-02-23 MED ORDER — POLYETHYLENE GLYCOL 3350 17 G PO PACK: 17.0000 g | PACK | Freq: Every day | ORAL | Status: DC | PRN

## 2016-02-23 MED ORDER — ACETAMINOPHEN 10 MG/ML IV SOLN
1000.0000 mg | Freq: Four times a day (QID) | INTRAVENOUS | Status: AC
  Administered 2016-02-23 – 2016-02-24 (×4): 1000 mg via INTRAVENOUS
  Filled 2016-02-23 (×4): qty 100

## 2016-02-23 MED ORDER — NEOSTIGMINE METHYLSULFATE 10 MG/10ML IV SOLN
INTRAVENOUS | Status: DC | PRN
  Administered 2016-02-23: 3 mg via INTRAVENOUS

## 2016-02-23 MED ORDER — NEOSTIGMINE METHYLSULFATE 5 MG/5ML IV SOSY
PREFILLED_SYRINGE | INTRAVENOUS | Status: AC
  Filled 2016-02-23: qty 5

## 2016-02-23 MED ORDER — MORPHINE SULFATE (PF) 4 MG/ML IV SOLN
2.0000 mg | INTRAVENOUS | Status: DC | PRN
  Administered 2016-02-23: 2 mg via INTRAVENOUS
  Filled 2016-02-23: qty 1

## 2016-02-23 SURGICAL SUPPLY — 65 items
BIT DRILL SKYLINE 12MM (BIT) IMPLANT
BLADE SURG ROTATE 9660 (MISCELLANEOUS) IMPLANT
BONE VIVIGEN FORMABLE 1.3CC (Bone Implant) ×2 IMPLANT
CANISTER SUCTION 2500CC (MISCELLANEOUS) ×2 IMPLANT
CLSR STERI-STRIP ANTIMIC 1/2X4 (GAUZE/BANDAGES/DRESSINGS) ×2 IMPLANT
CORDS BIPOLAR (ELECTRODE) ×2 IMPLANT
COVER SURGICAL LIGHT HANDLE (MISCELLANEOUS) ×3 IMPLANT
CRADLE DONUT ADULT HEAD (MISCELLANEOUS) ×2 IMPLANT
DEVICE ENDSKLTN TC NANOLCK 6MM (Cage) IMPLANT
DRAPE C-ARM 42X72 X-RAY (DRAPES) ×2 IMPLANT
DRAPE POUCH INSTRU U-SHP 10X18 (DRAPES) ×2 IMPLANT
DRAPE SURG 17X23 STRL (DRAPES) ×2 IMPLANT
DRAPE U-SHAPE 47X51 STRL (DRAPES) ×2 IMPLANT
DRILL BIT SKYLINE 12MM (BIT) ×2
DRSG AQUACEL AG ADV 3.5X 4 (GAUZE/BANDAGES/DRESSINGS) ×2 IMPLANT
DURAPREP 6ML APPLICATOR 50/CS (WOUND CARE) ×2 IMPLANT
ELECT COATED BLADE 2.86 ST (ELECTRODE) ×2 IMPLANT
ELECT PENCIL ROCKER SW 15FT (MISCELLANEOUS) ×2 IMPLANT
ELECT REM PT RETURN 9FT ADLT (ELECTROSURGICAL) ×2
ELECTRODE REM PT RTRN 9FT ADLT (ELECTROSURGICAL) ×1 IMPLANT
ENDOSKELETON TC NANOLOCK 6MM (Cage) ×2 IMPLANT
FILTER STRAW FLUID ASPIR (MISCELLANEOUS) ×1 IMPLANT
GLOVE BIO SURGEON STRL SZ 6.5 (GLOVE) ×2 IMPLANT
GLOVE BIOGEL PI IND STRL 6.5 (GLOVE) ×1 IMPLANT
GLOVE BIOGEL PI IND STRL 8.5 (GLOVE) ×1 IMPLANT
GLOVE BIOGEL PI INDICATOR 6.5 (GLOVE) ×1
GLOVE BIOGEL PI INDICATOR 8.5 (GLOVE) ×1
GLOVE SS BIOGEL STRL SZ 8.5 (GLOVE) ×1 IMPLANT
GLOVE SUPERSENSE BIOGEL SZ 8.5 (GLOVE) ×1
GOWN STRL REUS W/ TWL LRG LVL3 (GOWN DISPOSABLE) ×1 IMPLANT
GOWN STRL REUS W/TWL 2XL LVL3 (GOWN DISPOSABLE) ×4 IMPLANT
GOWN STRL REUS W/TWL LRG LVL3 (GOWN DISPOSABLE) ×2
GRAFT BNE MATRIX VG FRMBL SM 1 (Bone Implant) IMPLANT
KIT BASIN OR (CUSTOM PROCEDURE TRAY) ×2 IMPLANT
KIT ROOM TURNOVER OR (KITS) ×2 IMPLANT
NDL FILTER BLUNT 18X1 1/2 (NEEDLE) IMPLANT
NDL SPNL 18GX3.5 QUINCKE PK (NEEDLE) ×1 IMPLANT
NEEDLE FILTER BLUNT 18X 1/2SAF (NEEDLE) ×1
NEEDLE FILTER BLUNT 18X1 1/2 (NEEDLE) ×1 IMPLANT
NEEDLE SPNL 18GX3.5 QUINCKE PK (NEEDLE) ×2 IMPLANT
NS IRRIG 1000ML POUR BTL (IV SOLUTION) ×2 IMPLANT
PACK ORTHO CERVICAL (CUSTOM PROCEDURE TRAY) ×2 IMPLANT
PACK UNIVERSAL I (CUSTOM PROCEDURE TRAY) ×2 IMPLANT
PAD ARMBOARD 7.5X6 YLW CONV (MISCELLANEOUS) ×4 IMPLANT
PATTIES SURGICAL .25X.25 (GAUZE/BANDAGES/DRESSINGS) IMPLANT
PIN DISTRACTION 14 (PIN) ×2 IMPLANT
PLATE ONE LEVEL SKYLINE 14MM (Plate) ×1 IMPLANT
RESTRAINT LIMB HOLDER UNIV (RESTRAINTS) ×2 IMPLANT
SCREW SKYLINE VARIABLE LG (Screw) ×4 IMPLANT
SPONGE INTESTINAL PEANUT (DISPOSABLE) ×2 IMPLANT
SPONGE SURGIFOAM ABS GEL 100 (HEMOSTASIS) ×1 IMPLANT
SURGIFLO W/THROMBIN 8M KIT (HEMOSTASIS) IMPLANT
SUT BONE WAX W31G (SUTURE) ×2 IMPLANT
SUT MON AB 3-0 SH 27 (SUTURE) ×2
SUT MON AB 3-0 SH27 (SUTURE) ×1 IMPLANT
SUT SILK 3 0 TIES 10X30 (SUTURE) ×1 IMPLANT
SUT VIC AB 2-0 CT1 18 (SUTURE) ×2 IMPLANT
SYR BULB IRRIGATION 50ML (SYRINGE) ×2 IMPLANT
SYR CONTROL 10ML LL (SYRINGE) ×2 IMPLANT
SYR TB 1ML LUER SLIP (SYRINGE) ×1 IMPLANT
TAPE CLOTH 4X10 WHT NS (GAUZE/BANDAGES/DRESSINGS) ×2 IMPLANT
TAPE UMBILICAL COTTON 1/8X30 (MISCELLANEOUS) ×2 IMPLANT
TOWEL OR 17X24 6PK STRL BLUE (TOWEL DISPOSABLE) ×2 IMPLANT
TOWEL OR 17X26 10 PK STRL BLUE (TOWEL DISPOSABLE) ×2 IMPLANT
WATER STERILE IRR 1000ML POUR (IV SOLUTION) ×1 IMPLANT

## 2016-02-23 NOTE — Anesthesia Procedure Notes (Signed)
Procedure Name: Intubation Date/Time: 02/23/2016 8:44 AM Performed by: Lewie LoronGERMEROTH, JOHN Pre-anesthesia Checklist: Patient identified, Emergency Drugs available, Suction available and Patient being monitored Patient Re-evaluated:Patient Re-evaluated prior to inductionOxygen Delivery Method: Circle system utilized Preoxygenation: Pre-oxygenation with 100% oxygen Intubation Type: IV induction Ventilation: Mask ventilation without difficulty Laryngoscope Size: Glidescope (T4) Grade View: Grade I Tube type: Oral Tube size: 7.0 mm Number of attempts: 1 Airway Equipment and Method: Stylet and Video-laryngoscopy Placement Confirmation: ETT inserted through vocal cords under direct vision,  positive ETCO2 and breath sounds checked- equal and bilateral Secured at: 23 cm Tube secured with: Tape Dental Injury: Teeth and Oropharynx as per pre-operative assessment  Difficulty Due To: Difficulty was anticipated

## 2016-02-23 NOTE — Progress Notes (Signed)
Pt sleeps when left alone, appears comfortable. C/o pain 10/10 when awakened-goes right back to sleep. Nausea better after IV Phenergan. Dr Renold DonGermeroth here, fully updated.

## 2016-02-23 NOTE — Progress Notes (Signed)
Patient currently is having a stuffy nose with out a cough. Will make anesthesiologist aware

## 2016-02-23 NOTE — Op Note (Signed)
NAME:  Jessica Richards, Jessica Richards                ACCOUNT NO.:  [id number]  MEDICAL RECORD NO.:  [id number]  LOCATION:                                 FACILITY:  PHYSICIAN:  Verline Kong D. Shon BatonBrooks, M.D.      DATE OF BIRTH:  DATE OF PROCEDURE:  02/23/2016 DATE OF DISCHARGE:                              OPERATIVE REPORT   PREOPERATIVE DIAGNOSES:  Degenerative cervical disk disease with radiculopathy, C5-6.  POSTOPERATIVE DIAGNOSIS:  Degenerative cervical disk disease with radiculopathy, C5-6.  OPERATIVE PROCEDURE:  Anterior cervical diskectomy and fusion.  IMPLANT SYSTEM USED:  Size 6 medium lordotic nanoLOCK cervical cage packed with ViviGen allograft bone with DePuy Skyline size 14 anterior cervical plate affixed with 12 mm rescue screws.  COMPLICATIONS:  None.  CONDITION:  Stable.  FIRST ASSISTANT:  Anette Riedelarmen Mayo, [place].  HISTORY:  This is a very pleasant 61 year old woman, who has significant neck and neuropathic left arm pain.  Attempts at conservative management and failed to alleviate her symptoms and so we elected to proceed with surgery.  All appropriate risks, benefits, and alternatives were discussed with the patient and consent was obtained.  OPERATIVE NOTE:  The patient was brought to the operating room and placed supine on the operating table.  After successful induction of general anesthesia and endotracheal intubation, Ted's, SCDs were applied.  The arms were secured to the side and the anterior cervical spine was prepped and draped in a standard fashion.  Time-out was taken confirming patient, procedure, and all other pertinent important data. Once this was done, a midline incision was made starting at the midline and median to the left, centered over the C5-6 disk space.  Sharp dissection was carried out down to the deep fascia.  The platysma was sharply incised and I began sharply dissecting along the medial border of the sternocleidomastoid performing a standard Provencher-Robinson  approach to the anterior cervical spine.  The omohyoid muscle was identified, mobilized.  The carotid sheath was identified and protected laterally with a finger, and the trachea and esophagus were bluntly retracted with a finger and then protected with a retractor.  Kittner dissectors were used to remove the remaining prevertebral fascia to expose the anterior longitudinal ligament.  Once this was done, a needle was placed at the C5-6 disk space.  Fluoro was used to confirm needle was at the correct level.  Bipolar electrocautery was used to mobilize the longus colli muscle from the midbody of C5 to the midbody of C6.  Self-retaining Caspar retractors were placed underneath the longus colli muscle.  The endotracheal cuff was deflated.  The retractors were expanded.  The endotracheal cuff was re-expanded.  A double-action Leksell rongeur was used to remove the anterior exostosis and then annulotomy was performed.  Pituitary rongeurs were used to remove the bulk of the disk material and a 2 mm Kerrison punch was used to remove the overhanging osteophyte from the inferior aspect of the C5 vertebral body.  Distraction pins were then placed into the body of C5 and C6.  The intervertebral space was spread and then the retractors maintained the distraction.  I continued using curettes to work posteriorly removing all of the  disk material.  A 1 mm Kerrison rongeur was then used to remove the posterior osteophytes from the body of C4, 5 and 6.  A fine nerve hook was used to develop a plane underneath the posterior longitudinal ligament and then a 1 mm Kerrison was used to resect the posterior longitudinal ligament.  I then undercut the uncovertebral joints to improve my foraminal decompression.  At this point, I was very pleased with the decompression.  The hard disk osteophytes were removed.  The decompression was complete.  I rasped the endplates, I had bleeding subchondral bone and then  trialed and elected to use a size 6 medium intervertebral cage.  This was obtained, packed with ViviGen and then malleted to the appropriate depth.  The distraction pins were removed.  The screw holes were closed with bone wax and the anterior cervical plate was then affixed with 12 mm self- drilling screws.  At the end of the case, all needle and sponge counts were correct. Final x-rays were taken, which were satisfactory and there were no intraoperative complications.  At this point, with the hardware completely in place, I irrigated copiously with normal saline, made sure I had hemostasis using bipolar electrocautery and closed the platysma with interrupted 2-0 Vicryl sutures and the skin with 3-0 Monocryl. Steri-Strips, dry dressing, and Aspen collar were required.  Counts were again correct and the patient was extubated, transferred to the PACU without incident.  At the end of the case, all needle and sponge counts were correct.  No adverse intraoperative events.     Lance Huaracha D. Shon Baton, M.D.   ______________________________ Donn Pierini. Shon Baton, M.D.    DDB/MEDQ  D:  02/23/2016  T:  02/23/2016  Job:  [id number]

## 2016-02-23 NOTE — Brief Op Note (Signed)
02/23/2016  9:54 AM  PATIENT:  Jessica Richards  61 y.o. female  PRE-OPERATIVE DIAGNOSIS:  Cervical degenerative disc disease with radiculopathy  POST-OPERATIVE DIAGNOSIS:  Cervical degenerative disc disease with radiculopathy  PROCEDURE:  Procedure(s) with comments: ANTERIOR CERVICAL DECOMPRESSION/DISCECTOMY C5-6 (N/A) - Requests 2.5 hrs  SURGEON:  Surgeon(s) and Role:    * Venita Lickahari Hardy Harcum, MD - Primary  PHYSICIAN ASSISTANT:   ASSISTANTS: Camen Mayo   ANESTHESIA:   general  EBL:  Total I/O In: 1600 [I.V.:1600] Out: -   BLOOD ADMINISTERED:none  DRAINS: none   LOCAL MEDICATIONS USED:  MARCAINE     SPECIMEN:  No Specimen  DISPOSITION OF SPECIMEN:  N/A  COUNTS:  YES  TOURNIQUET:  * No tourniquets in log *  DICTATION: .Other Dictation: Dictation Number [id number]  PLAN OF CARE: Admit for overnight observation  PATIENT DISPOSITION:  PACU - hemodynamically stable.

## 2016-02-23 NOTE — H&P (Signed)
History of Present Illness  The patient is a [age] year old female who comes in today for a preoperative History and Physical. The patient is scheduled for a ACDF C5-6 to be performed by Dr. Debria Garretahari D. Shon BatonBrooks[facility] . Please see the hospital record for complete dictated history and physical. Pt reports a heart murmur. Pt reports hx of Asthma.  Problem List/Past Medical  Bursitis of left hip (M70.72)  Knee Pain (M25.569)  Degeneration of intervertebral disc at C5-C6 level (M50.322)  Osteoarthritis of spine with radiculopathy, cervical region (M47.22)  Fx closed phalanx, foot (826.0) [06/20/2001]: (Marked as Inactive) Problems Reconciled   Allergies  No Known Drug Allergies [06/18/2011]: Allergies Reconciled   Family History  Depression  sister Cerebrovascular Accident  Maternal Grandfather, Maternal Grandmother, Paternal Grandfather, Paternal Grandmother. Congestive Heart Failure  Father. Diabetes Mellitus  Father. father First Degree Relatives  reported Heart Disease  Father, Maternal Grandfather, Maternal Grandmother, Mother, Paternal Emelia LoronGrandfather, Paternal Grandmother. father and grandmother fathers side Heart disease in female family member before age 61  Hypertension  Father. mother, father and sister Osteoarthritis  Father, Maternal Grandfather, Maternal Grandmother, Mother, Paternal Emelia LoronGrandfather, Paternal Grandmother. mother, father and sister Osteoporosis  Mother. mother Severe allergy  Father. father  Social History  Tobacco use  Former smoker. 08/06/2013: smoke(d) 2 1/2 pack(s) per day former smoker Tobacco / smoke exposure  08/06/2013: no Alcohol use  current drinker; only occasionally per week Drug/Alcohol Rehab (Currently)  no Drug/Alcohol Rehab (Previously)  no Illicit drug use  no Pain Contract  no Children  0 Current drinker  08/06/2013: Currently drinks only occasionally per week Current work status  working  full time Exercise  Exercises daily; does running / walking and gym / [employer] Exercises weekly; does running / walking and gym / weights Living situation  live with spouse Marital status  married No history of drug/alcohol rehab  Not under pain contract  Number of flights of stairs before winded  2-3  Medication History  Calcium Carbonate (1250 (500 Ca)MG Tablet Chewable, Oral) Active. (QD) Prempro (0.45-1.5MG  Tablet, Oral) Active. (QD) Aleve (220MG  Capsule, Oral) Active. (PRN) Multiple Vitamins/Womens (1 Oral) Active. Theophylline ER (400MG  Capsule ER 24HR, Oral) Active. (QD) Proventil HFA (108 (90 Base)MCG/ACT Aerosol Soln, Inhalation) Active. (PRN) ProAir HFA (108 (90 Base)MCG/ACT Aerosol Soln, Inhalation) Active. (PRN) Spiriva HandiHaler (18MCG Capsule, Inhalation) Active. (QD) Claritin (10MG  Capsule, Oral) Active. (PRN) Symbicort (160-4.5MCG/ACT Aerosol, Inhalation) Active. (2 puffs bid) Boniva (150MG  Tablet, Oral) Active. (1 po q month) Medications Reconciled  Vitals 02/14/2016 8:04 AM Weight: 138.03 lb Height: 62.5in Body Surface Area: 1.64 m Body Mass Index: 24.84 kg/m  Temp.: 68F  Pulse: 58 (Regular)  BP: 145/86 (Sitting, Left Arm, Standard)  General Appearance-Not in acute distress. Orientation-Oriented X3. Build & Nutrition-Well nourished and Well developed.  Integumentary General Characteristics Surgical Scars - no surgical scar evidence of previous cervical surgery. Cervical Spine-Skin examination of the cervical spine is without deformity, skin lesions, lacerations or abrasions.  Chest and Lung Exam Auscultation Breath sounds - Normal and Clear.  Cardiovascular Auscultation Rhythm - Regular rate and rhythm.  Peripheral Vascular Upper Extremity Palpation - Radial pulse - Bilateral - 2+.  Neurologic Sensation Upper Extremity - Left - sensation is diminished in the upper extremity. Right - sensation is intact in  the upper extremity. Reflexes Biceps Reflex - Bilateral - 2+. Brachioradialis Reflex - Bilateral - 2+. Triceps Reflex - Bilateral - 2+. Hoffman's Sign - Bilateral - Hoffman's sign not  present.  Musculoskeletal Spine/Ribs/Pelvis  Cervical Spine : Inspection and Palpation - Tenderness - right cervical paraspinals tender to palpation, left cervical paraspinals tender to palpation and left trapezius tender to palpation. Strength and Tone: Strength - Deltoid - Bilateral - 5/5. Biceps - Bilateral - 5/5. Triceps - Bilateral - 5/5. Wrist Extension - Bilateral - 5/5. Hand Grip - Bilateral - 5/5. Heel walk - Bilateral - able to heel walk without difficulty. Toe Walk - Bilateral - able to walk on toes without difficulty. Heel-Toe Walk - Bilateral - able to heel-toe walk without difficulty. ROM - Flexion - Moderately Decreased and painful. Extension - Moderately Decreased and painful. Left Lateral Flexion - Moderately Decreased and painful. Right Lateral Flexion - Moderately Decreased and painful. Left Rotation - Moderately Decreased and painful. Right Rotation - Moderately Decreased and painful. Pain - neither flexion or extension is more painful than the other. Cervical Spine - Special Testing - axial compression test negative, cross chest impingement test negative. Non-Anatomic Signs - No non-anatomic signs present. Upper Extremity Range of Motion - No truesholder pain with IR/ER of the shoulders.  The patient's MRI shows degenerative disc disease at C5-6, retroposition of C5 on C6, symmetrical right lateral recess stenosis, moderate left foraminal stenosis. At C6-7, there are some mild degenerative changes without canal or cord deformity or significant left-sided stenosis. At this point, her primary pain is primarily coming from the C5-6.  Assessment & Plan  Goal Of Surgery: Discussed that goal of surgery is to reduce pain and improve function and quality of life. Patient is aware that despite all appropriate  treatment that there pain and function could be the same, worse, or different.  Anterior cervical fusion:Risks of surgery include, but are not limited to: Throat pain, swallowing difficulty, hoarseness or change in voice, death, stroke, paralysis, nerve root damage/injury, bleeding, blood clots, loss of bowel/bladder control, hardware failure, or mal-position, spinal fluid leak, adjacent segment disease, non-union, need for further surgery, ongoing or worse pain, infection. Post-operative bleeding or swelling that could require emergent surgery.   She continues to have neck pain that radiates into the left upper extremity. She describes pain coming down the biceps, extensor surface and into the hand. She has no weakness, but the headaches, neck pain, and left arm pain is just progressively worse. The prednisone helped for a short period of time. At this point, her new MRI shows again the degenerative disc disease at C5-6, retroposition of C5 on C6, symmetrical right lateral recess stenosis. There is moderate left foraminal stenosis at C6-7, there are some mild degenerative disc changes without canal or cord deformity or significant left-sided stenosis. At this point, her primary pain I believe is coming from the C5-6 disc space.

## 2016-02-23 NOTE — Discharge Instructions (Signed)

## 2016-02-23 NOTE — Transfer of Care (Signed)
Immediate Anesthesia Transfer of Care Note  Patient: Jessica Richards  Procedure(s) Performed: Procedure(s) with comments: ANTERIOR CERVICAL DECOMPRESSION/DISCECTOMY C5-6 (N/A) - Requests 2.5 hrs  Patient Location: PACU  Anesthesia Type:General  Level of Consciousness: awake, alert  and patient cooperative  Airway & Oxygen Therapy: Patient Spontanous Breathing and Patient connected to nasal cannula oxygen  Post-op Assessment: Report given to RN, Post -op Vital signs reviewed and stable, Patient moving all extremities X 4 and Patient able to stick tongue midline  Post vital signs: Reviewed and stable  Last Vitals:  Vitals:   02/23/16 0608 02/23/16 1001  BP: 128/88   Pulse: 63   Resp: 20   Temp:  37.1 C    Last Pain:  Vitals:   02/23/16 0605  PainSc: 5       Patients Stated Pain Goal: 2 (02/23/16 16100605)  Complications: No apparent anesthesia complications

## 2016-02-24 ENCOUNTER — Encounter (HOSPITAL_COMMUNITY): Payer: Self-pay | Admitting: Orthopedic Surgery

## 2016-02-24 DIAGNOSIS — M50122 Cervical disc disorder at C5-C6 level with radiculopathy: Secondary | ICD-10-CM | POA: Diagnosis not present

## 2016-02-24 NOTE — Evaluation (Signed)
Occupational Therapy Evaluation Patient Details Name: Jessica ChurnSharon R Hurn MRN: [id number] DOB: 02/23/1955 Today's Date: 02/24/2016    History of Present Illness 61 yo female s/p ACDF C5-6   Past Medical History:  Diagnosis Date  . Allergic rhinitis   . Anxiety   . Arthritis   . Asthma   . COPD bronchitis   . Dyspnea    with exertion  . Headache    due to neck psin  . Heart murmur    slight  no cardiology  . Mild mitral regurgitation by prior echocardiogram 12/17/06  . Osteopenia    DEXA @ gyn 11/909/10: -2.0 DEXA @ gyn 11/20/10: -1.8 DEXA @ gyn 11/20/12: -2.2  . Shingles 2012      Clinical Impression   Patient evaluated by Occupational Therapy with no further acute OT needs identified. All education has been completed and the patient has no further questions. See below for any follow-up Occupational Therapy or equipment needs. OT to sign off. Thank you for referral.      Follow Up Recommendations  No OT follow up    Equipment Recommendations  None recommended by OT    Recommendations for Other Services       Precautions / Restrictions Precautions Precautions: Cervical Precaution Comments: handout provided and reviewed for all adls.  Required Braces or Orthoses: Cervical Brace Cervical Brace: Hard collar;At all times      Mobility Bed Mobility Overal bed mobility: Independent                Transfers Overall transfer level: Independent               General transfer comment: educated on weight lifting restrictions    Balance                                            ADL Overall ADL's : Independent                                       General ADL Comments: educated on don doff brace and demonstrated mod I   Cervical precautions ( handout provided): Educated patient on don doff brace with return demonstration, educated on oral care using cups, washing face with cloth, never to wash directly on  incision site, avoid neck rotation flexion and extension, positioning with pillows in chair for bil UE, sleeping positioning, avoiding pushing / pulling with bil UE, and fine motor exercises ( handout provided). Instructed on wall slides FF 90 degrees and table top slides.  Pt educated on need to notify doctor / RN of swallowing changes or choking..    Vision     Perception     Praxis      Pertinent Vitals/Pain Pain Assessment: 0-10 Pain Score: 4  Pain Location: throat Pain Descriptors / Indicators: Sore Pain Intervention(s): Monitored during session;Premedicated before session;Repositioned     Hand Dominance Right   Extremity/Trunk Assessment Upper Extremity Assessment Upper Extremity Assessment: Overall WFL for tasks assessed;LUE deficits/detail LUE Deficits / Details: reports pain improved but difficult to tell yet because shoulders are so sore   Lower Extremity Assessment Lower Extremity Assessment: Defer to PT evaluation   Cervical / Trunk Assessment Cervical / Trunk Assessment: Other exceptions (s/p surg)   Communication Communication Communication: No difficulties  Cognition Arousal/Alertness: Awake/alert Behavior During Therapy: WFL for tasks assessed/performed Overall Cognitive Status: Within Functional Limits for tasks assessed                     General Comments       Exercises       Shoulder Instructions      Home Living Family/patient expects to be discharged to:: Private residence   Available Help at Discharge: Family;Available 24 hours/day;Other (Comment) (until monday) Type of Home: House Home Access: Stairs to enter [employer] of Steps: 3 Entrance Stairs-Rails: None Home Layout: Two level;Able to live on main level with bedroom/bathroom;Laundry or work area in basement     [employer] Shower/Tub: Chief Strategy Officer: Standard     Home Equipment: None          Prior Functioning/Environment Level of  Independence: Independent                 OT Problem List:     OT Treatment/Interventions:      OT Goals(Current goals can be found in the care plan section)    OT Frequency:     Barriers to D/C:            Co-evaluation              End of Session Equipment Utilized During Treatment: Cervical collar Nurse Communication: Mobility status;Precautions  Activity Tolerance: Patient tolerated treatment well Patient left: in chair;with call bell/phone within reach   Time: 0735-0754 OT Time Calculation (min): 19 min Charges:  OT General Charges $OT Visit: 1 Procedure OT Evaluation $OT Eval Moderate Complexity: 1 Procedure G-Codes:    Boone Master B 03-11-2016, 8:01 AM   Mateo Flow   OTR/L Pager: [phone] Office: [phone] .

## 2016-02-24 NOTE — Anesthesia Postprocedure Evaluation (Signed)
Anesthesia Post Note  Patient: Jessica Richards  Procedure(s) Performed: Procedure(s) (LRB): ANTERIOR CERVICAL DECOMPRESSION/DISCECTOMY C5-6 (N/A)  Patient location during evaluation: PACU Anesthesia Type: General Level of consciousness: sedated and patient cooperative Pain management: pain level controlled Vital Signs Assessment: post-procedure vital signs reviewed and stable Respiratory status: spontaneous breathing Cardiovascular status: stable Anesthetic complications: no        Last Vitals:  Vitals:   02/24/16 0402 02/24/16 0758  BP: 126/68 111/68  Pulse: 78 66  Resp: 18 18  Temp: 36.9 C 36.5 C    Last Pain:  Vitals:   02/24/16 0830  TempSrc:   PainSc: 3    Pain Goal: Patients Stated Pain Goal: 2 (02/24/16 0830)               Lewie LoronJohn Duane Earnshaw

## 2016-02-24 NOTE — Evaluation (Signed)
Physical Therapy Evaluation and Discharge Patient Details Name: Jessica Richards MRN: [id number] DOB: 11/05/1955 Today's Date: 02/24/2016   History of Present Illness  61 yo female s/p ACDF C5-6 on 02/23/16.  Clinical Impression  Patient evaluated by Physical Therapy with no further acute PT needs identified. All education has been completed and the patient has no further questions. At the time of PT eval pt was able to perform transfers and ambulation with modified independence to independence with no AD. See below for any follow-up Physical Therapy or equipment needs. PT is signing off. Thank you for this referral.     Follow Up Recommendations No PT follow up    Equipment Recommendations  None recommended by PT    Recommendations for Other Services       Precautions / Restrictions Precautions Precautions: Cervical Precaution Comments: handout provided and reviewed for all adls.  Required Braces or Orthoses: Cervical Brace Cervical Brace: Hard collar;At all times Restrictions Weight Bearing Restrictions: No      Mobility  Bed Mobility Overal bed mobility: Independent             General bed mobility comments: Pt sitting up in recliner upon PT arrival.   Transfers Overall transfer level: Independent               General transfer comment: Pt demonstrated good technique and maintenance of precautions during transfers sit<>stand.   Ambulation/Gait Ambulation/Gait assistance: Modified independent (Device/Increase time) Ambulation Distance (Feet): 400 Feet Assistive device: None Gait Pattern/deviations: Decreased stride length Gait velocity: Decreased Gait velocity interpretation: Below normal speed for age/gender General Gait Details: Generally guarded gait pattern but no unsteadiness or LOB noted.   Stairs Stairs: Yes Stairs assistance: Modified independent (Device/Increase time) Stair Management: One rail Right;Alternating pattern;Forwards Number of  Stairs: 10 General stair comments: Pt demonstrated proper sequencing and general safety.   Wheelchair Mobility    Modified Rankin (Stroke Patients Only)       Balance Overall balance assessment: No apparent balance deficits (not formally assessed)                                           Pertinent Vitals/Pain Pain Assessment: 0-10 Pain Score: 4  Pain Location: throat Pain Descriptors / Indicators: Sore Pain Intervention(s): Limited activity within patient's tolerance;Monitored during session;Repositioned    Home Living Family/patient expects to be discharged to:: Private residence   Available Help at Discharge: Family;Available 24 hours/day;Other (Comment) (until monday) Type of Home: House Home Access: Stairs to enter Entrance Stairs-Rails: None Entrance Stairs-Number of Steps: 3 Home Layout: Two level;Able to live on main level with bedroom/bathroom;Laundry or work area in [employer] Insurancebasement Home Equipment: None      Prior Function Level of Independence: Independent               Hand Dominance   Dominant Hand: Right    Extremity/Trunk Assessment   Upper Extremity Assessment Upper Extremity Assessment: Defer to OT evaluation LUE Deficits / Details: reports pain improved but difficult to tell yet because shoulders are so sore    Lower Extremity Assessment Lower Extremity Assessment: Overall WFL for tasks assessed    Cervical / Trunk Assessment Cervical / Trunk Assessment: Other exceptions (s/p surg)  Communication   Communication: No difficulties  Cognition Arousal/Alertness: Awake/alert Behavior During Therapy: WFL for tasks assessed/performed Overall Cognitive Status: Within Functional Limits for tasks assessed  General Comments General comments (skin integrity, edema, etc.): Adjusted cervical collar for better fit and posture.     Exercises     Assessment/Plan    PT Assessment Patent does not need any  further PT services  PT Problem List            PT Treatment Interventions      PT Goals (Current goals can be found in the Care Plan section)  Acute Rehab PT Goals Patient Stated Goal: Home today PT Goal Formulation: All assessment and education complete, DC therapy    Frequency     Barriers to discharge        Co-evaluation               End of Session Equipment Utilized During Treatment: Cervical collar Activity Tolerance: Patient tolerated treatment well Patient left: in chair;with call bell/phone within reach Nurse Communication: Mobility status    Functional Assessment Tool Used: Clinical judgement Functional Limitation: Mobility: Walking and moving around Mobility: Walking and Moving Around Current Status (U9811): At least 1 percent but less than 20 percent impaired, limited or restricted Mobility: Walking and Moving Around Goal Status [phone]): At least 1 percent but less than 20 percent impaired, limited or restricted Mobility: Walking and Moving Around Discharge Status [phone]): At least 1 percent but less than 20 percent impaired, limited or restricted    Time: 0811-0824 PT Time Calculation (min) (ACUTE ONLY): 13 min   Charges:   PT Evaluation $PT Eval Moderate Complexity: 1 Procedure     PT G Codes:   PT G-Codes **NOT FOR INPATIENT CLASS** Functional Assessment Tool Used: Clinical judgement Functional Limitation: Mobility: Walking and moving around Mobility: Walking and Moving Around Current Status (Z3086): At least 1 percent but less than 20 percent impaired, limited or restricted Mobility: Walking and Moving Around Goal Status [phone]): At least 1 percent but less than 20 percent impaired, limited or restricted Mobility: Walking and Moving Around Discharge Status [phone]): At least 1 percent but less than 20 percent impaired, limited or restricted    Marylynn Pearson 02/24/2016, 9:28 AM   Conni Slipper, PT, DPT Acute Rehabilitation Services Pager:  [phone]

## 2016-02-24 NOTE — Progress Notes (Signed)
Pt doing well. Pt and husband given D/C instructions with Rx's, verbal understanding was provided. Pt's incision is clean and dry with no sign of infection. Pt's IV was removed prior to D/C. Pt D/C'd home via wheelchair @ 1300 per MD order. Pt is stable @ D/C and has no other needs at this time. Aminat Shelburne, RN  

## 2016-02-24 NOTE — Progress Notes (Signed)
.     Subjective: Procedure(s) (LRB): ANTERIOR CERVICAL DECOMPRESSION/DISCECTOMY C5-6 (N/A) 1 Day Post-Op  Patient reports pain as 2 on 0-10 scale.  Reports decreased arm pain reports incisional neck pain   Positive void Negative bowel movement Positive flatus Negative chest pain or shortness of breath  Objective: Vital signs in last 24 hours: Temp:  [97.7 F (36.5 C)-99 F (37.2 C)] 97.7 F (36.5 C) (02/16 0758) Pulse Rate:  [64-78] 66 (02/16 0758) Resp:  [12-18] 18 (02/16 0758) BP: (105-126)/(65-74) 111/68 (02/16 0758) SpO2:  [94 %-100 %] 94 % (02/16 0758) FiO2 (%):  [2 %] 2 % (02/15 1252)  Intake/Output from previous day: 02/15 0701 - 02/16 0700 In: 3270 [P.O.:270; I.V.:2100; IV Piggyback:900] Out: 25 [Blood:25]  Labs: No results for input(s): WBC, RBC, HCT, PLT in the last 72 hours. No results for input(s): NA, K, CL, CO2, BUN, CREATININE, GLUCOSE, CALCIUM in the last 72 hours. No results for input(s): LABPT, INR in the last 72 hours.  Physical Exam: Neurologically intact ABD soft Intact pulses distally Incision: dressing C/D/I Compartment soft  Assessment/Plan: Patient stable  xrays satisfactory Mobilization with physical therapy Encourage incentive spirometry Continue care  Advance diet Up with therapy  Plan on discharge today  Venita Lickahari Jadyn Barge, MD [facility] [phone]

## 2016-02-28 ENCOUNTER — Encounter (HOSPITAL_COMMUNITY): Payer: Self-pay | Admitting: Orthopedic Surgery

## 2016-03-12 NOTE — Progress Notes (Signed)
OT NOTE- LATE GCODE ENTRY     02/24/16 0700  OT G-codes **NOT FOR INPATIENT CLASS**  Functional Assessment Tool Used Clinical judgement  Functional Limitation Self care  Self Care [facility] Status [phone][id number][facility]  Self [facility] Goal Status (U0454([facility]8988) [facility]  Self Care Discharge Status [phone]([id number]) [facility]         [name], Jessica   OTR/L Pager: [phone] Office: [phone] .

## 2016-03-13 NOTE — Discharge Summary (Signed)
Physician Discharge Summary  Patient ID: Jessica Richards MRN: [id number] DOB/AGE: 02/01/1955 61 y.o.  Admit date: 02/23/2016 Discharge date: 03/13/2016  Admission Diagnoses:  Cervical DDD  Discharge Diagnoses:  Active Problems:   Neck pain   Past Medical History:  Diagnosis Date  . Allergic rhinitis   . Anxiety   . Arthritis   . Asthma   . COPD bronchitis   . Dyspnea    with exertion  . Headache    due to neck psin  . Heart murmur    slight  no cardiology  . Mild mitral regurgitation by prior echocardiogram 12/17/06  . Osteopenia    DEXA @ gyn 11/909/10: -2.0 DEXA @ gyn 11/20/10: -1.8 DEXA @ gyn 11/20/12: -2.2  . Shingles 2012    Surgeries: Procedure(s): ANTERIOR CERVICAL DECOMPRESSION/DISCECTOMY C5-6 on 02/23/2016   Consultants (if any):   Discharged Condition: Improved  Hospital Course: Jessica Richards is an 61 y.o. female who was admitted 02/23/2016 with a diagnosis of Cervical DDD and went to the operating room on 02/23/2016 and underwent the above named procedures.  Post op day  1 pt reported decreased arm pain.  She reports incisional pain controled on oral medications. Pt is voidiing w/o difficulty.  Pt ambulating in the hall.  Pt cleared by PT for DC.  She was given perioperative antibiotics:  Anti-infectives    Start     Dose/Rate Route Frequency Ordered Stop   02/23/16 1600  ceFAZolin (ANCEF) IVPB 2g/100 mL premix     2 g 200 mL/hr over 30 Minutes Intravenous Every 8 hours 02/23/16 1241 02/23/16 2334   02/23/16 0530  ceFAZolin (ANCEF) IVPB 2g/100 mL premix     2 g 200 mL/hr over 30 Minutes Intravenous 30 min pre-op 02/23/16 0530 02/23/16 0748    .  She was given sequential compression devices, early ambulation, and TED for DVT prophylaxis.  She benefited maximally from the hospital stay and there were no complications.    Recent vital signs:  Vitals:   02/24/16 0402 02/24/16 0758  BP: 126/68 111/68  Pulse: 78 66  Resp: 18 18  Temp: 98.4 F (36.9 C)  97.7 F (36.5 C)    Recent laboratory studies:  Lab Results  Component Value Date   HGB 12.0 02/15/2016   HGB 12.2 01/23/2016   HGB 13.1 07/14/2007   Lab Results  Component Value Date   WBC 5.5 02/15/2016   PLT 233 02/15/2016   No results found for: INR Lab Results  Component Value Date   NA 140 02/15/2016   K 4.1 02/15/2016   CL 110 02/15/2016   CO2 23 02/15/2016   BUN 14 02/15/2016   CREATININE 0.77 02/15/2016   GLUCOSE 89 02/15/2016    Discharge Medications:   Allergies as of 02/24/2016      Reactions   Voltaren [diclofenac]    rash      Medication List    STOP taking these medications   ALEVE 220 MG tablet Generic drug:  naproxen sodium   ibuprofen 200 MG tablet Commonly known as:  ADVIL,MOTRIN     TAKE these medications   albuterol (2.5 MG/3ML) 0.083% nebulizer solution Commonly known as:  PROVENTIL USE 1 VIAL IN NEBULIZER 4 TIMES A DAY AS NEEDED   albuterol 108 (90 Base) MCG/ACT inhaler Commonly known as:  PROAIR HFA USE 2 INHALATIONS ORALLY   INTO THE LUNGS EVERY 6     HOURS AS NEEDEDFOR        WHEEZING  budesonide-formoterol 160-4.5 MCG/ACT inhaler Commonly known as:  SYMBICORT Inhale 2 puffs into the lungs 2 (two) times daily.   CALCIUM 500 + D PO Take 3 each by mouth daily.   estrogen (conjugated)-medroxyprogesterone 0.3-1.5 MG tablet Commonly known as:  PREMPRO Take 1 tablet by mouth daily.   ibandronate 150 MG tablet Commonly known as:  BONIVA Take 1 tablet once a month   methocarbamol 500 MG tablet Commonly known as:  ROBAXIN Take 1 tablet (500 mg total) by mouth 3 (three) times daily as needed for muscle spasms.   multivitamin with minerals Tabs tablet Take 2 tablets by mouth daily.   ondansetron 4 MG tablet Commonly known as:  ZOFRAN Take 1 tablet (4 mg total) by mouth every 8 (eight) hours as needed for nausea or vomiting.   oxyCODONE-acetaminophen 10-325 MG tablet Commonly known as:  PERCOCET Take 1 tablet by mouth  every 4 (four) hours as needed for pain.   theophylline 400 MG 24 hr tablet Commonly known as:  UNIPHYL Take 1 tablet (400 mg total) by mouth daily.   tiotropium 18 MCG inhalation capsule Commonly known as:  SPIRIVA Place 1 capsule (18 mcg total) into inhaler and inhale daily.       Diagnostic Studies: Dg Cervical Spine 2 Or 3 Views  Result Date: 02/23/2016 CLINICAL DATA:  Status post cervical fusion EXAM: CERVICAL SPINE - 2 VIEW COMPARISON:  Intraoperative films from earlier in the same day FINDINGS: Cervical fusion is noted at C5-6 with anterior fixation. The overall appearance is similar to that seen on the operative film. Degenerative changes at C6-7 are noted. IMPRESSION: Status post C5-6 fusion. Electronically Signed   By: Alcide CleverMark  Lukens M.D.   On: 02/23/2016 10:45   Dg Cervical Spine 2-3 Views  Result Date: 02/23/2016 CLINICAL DATA:  Cervical spine surgery. EXAM: DG C-ARM 61-120 MIN; CERVICAL SPINE - 2-3 VIEW COMPARISON:  No recent prior. FINDINGS: C5-C6 anterior and interbody fusion. Anatomic alignment. No acute bony abnormality. IMPRESSION: C5-C6 anterior and interbody fusion. Electronically Signed   By: Maisie Fushomas  Register   On: 02/23/2016 09:43   Dg C-arm 1-60 Min  Result Date: 02/23/2016 CLINICAL DATA:  Cervical spine surgery. EXAM: DG C-ARM 61-120 MIN; CERVICAL SPINE - 2-3 VIEW COMPARISON:  No recent prior. FINDINGS: C5-C6 anterior and interbody fusion. Anatomic alignment. No acute bony abnormality. IMPRESSION: C5-C6 anterior and interbody fusion. Electronically Signed   By: Maisie Fushomas  Register   On: 02/23/2016 09:43    Disposition: 01-Home or Self Care Pt will present to clinic in 2 weeks Post op medication provided   Follow-up Information    BROOKS,DAHARI D, MD. Schedule an appointment as soon as possible for a visit in 2 weeks.   Specialty:  Orthopedic Surgery Why:  If symptoms worsen, For suture removal, For wound re-check Contact information: [address] [place] [address] [phone]            Signed: Kirt BoysMayo, Milina Pagett Christina 03/13/2016, 8:29 PM

## 2016-04-19 ENCOUNTER — Other Ambulatory Visit: Payer: Self-pay | Admitting: Internal Medicine

## 2016-07-31 ENCOUNTER — Ambulatory Visit: Payer: BLUE CROSS/BLUE SHIELD

## 2016-08-24 ENCOUNTER — Telehealth: Payer: Self-pay | Admitting: Internal Medicine

## 2016-08-24 NOTE — Telephone Encounter (Signed)
Patient states she has a cat she is having to put down on Monday.  States she is very upset about this.  Patient states she went through the death of her parents within a year apart and it was not as bad as this has been on her.  She is requesting something to be prescribed to her short term.  Patient use [facility] on [address]

## 2016-08-24 NOTE — Telephone Encounter (Signed)
Pt will need to make appt to be able to rx something. She has not seen Dr. Okey Dupre since Jan, and for new controls to be rx she will need to have OV for evaluation. Pls call and make appt w/any provider...Raechel Chute

## 2016-08-27 ENCOUNTER — Ambulatory Visit (INDEPENDENT_AMBULATORY_CARE_PROVIDER_SITE_OTHER): Payer: BLUE CROSS/BLUE SHIELD | Admitting: Family

## 2016-08-27 ENCOUNTER — Encounter: Payer: Self-pay | Admitting: Family

## 2016-08-27 VITALS — BP 122/80 | HR 53 | Temp 97.9°F | Resp 16 | Ht 62.0 in | Wt 131.1 lb

## 2016-08-27 DIAGNOSIS — F4320 Adjustment disorder, unspecified: Secondary | ICD-10-CM

## 2016-08-27 DIAGNOSIS — L989 Disorder of the skin and subcutaneous tissue, unspecified: Secondary | ICD-10-CM | POA: Diagnosis not present

## 2016-08-27 DIAGNOSIS — F4321 Adjustment disorder with depressed mood: Secondary | ICD-10-CM

## 2016-08-27 NOTE — Progress Notes (Signed)
Subjective:    Patient ID: Jessica Richards, female    DOB: December 26, 1955, 61 y.o.   MRN: [id number]  Chief Complaint  Patient presents with  . Depression    thinks she needs antidepressants to help with the loss of her cat, look at left thumb    HPI:  Jessica Richards is a 61 y.o. female who  has a past medical history of Allergic rhinitis; Anxiety; Arthritis; Asthma; COPD bronchitis; Dyspnea; Headache; Heart murmur; Mild mitral regurgitation by prior echocardiogram (12/17/06); Osteopenia; and Shingles (2012). and presents today for an acute office visit.   1.) Grief - This is a new problem. Associated symptoms of grief and increased anxiety have been going on for several days following having to put her cat to sleep due to illness. Describes feeling of guilt and questioning whether it was the right decision. Denies suicidal ideations. Has never had issues with anxiety or depression in the past.   2.) Thumb - This is a new problem. Decreased range of motion and pain located in her left thumb has been going on for a couple of months. Denies any trauma or injury. Course of the symptoms have come on gradually and been refractory to the modifying factors of ibuprofen which has not helped very much. She is right hand dominant. Severity does effect her ability to perform some activities of daily living including washing dishes.   Allergies  Allergen Reactions  . Voltaren [Diclofenac]     rash      Outpatient Medications Prior to Visit  Medication Sig Dispense Refill  . albuterol (PROAIR HFA) 108 (90 Base) MCG/ACT inhaler USE 2 INHALATIONS ORALLY   INTO THE LUNGS EVERY 6     HOURS AS NEEDEDFOR        WHEEZING 25.5 g 3  . albuterol (PROVENTIL) (2.5 MG/3ML) 0.083% nebulizer solution USE 1 VIAL IN NEBULIZER 4 TIMES A DAY AS NEEDED 225 mL 3  . Calcium Carbonate-Vitamin D (CALCIUM 500 + D PO) Take 3 each by mouth daily.    Marland Kitchen estrogen, conjugated,-medroxyprogesterone (PREMPRO) 0.3-1.5 MG tablet Take 1  tablet by mouth daily.    Marland Kitchen ibandronate (BONIVA) 150 MG tablet Take 1 tablet once a month    . methocarbamol (ROBAXIN) 500 MG tablet Take 1 tablet (500 mg total) by mouth 3 (three) times daily as needed for muscle spasms. 21 tablet 0  . Multiple Vitamin (MULITIVITAMIN WITH MINERALS) TABS Take 2 tablets by mouth daily.      . ondansetron (ZOFRAN) 4 MG tablet Take 1 tablet (4 mg total) by mouth every 8 (eight) hours as needed for nausea or vomiting. 20 tablet 0  . oxyCODONE-acetaminophen (PERCOCET) 10-325 MG tablet Take 1 tablet by mouth every 4 (four) hours as needed for pain. 42 tablet 0  . theophylline (UNIPHYL) 400 MG 24 hr tablet Take 1 tablet (400 mg total) by mouth daily. 90 tablet 3  . tiotropium (SPIRIVA) 18 MCG inhalation capsule Place 1 capsule (18 mcg total) into inhaler and inhale daily. 90 capsule 3  . SYMBICORT 160-4.5 MCG/ACT inhaler INHALE 2 PUFFS INTO THE LUNGS 2 (TWO) TIMES DAILY. 30.6 Inhaler 2   No facility-administered medications prior to visit.       Past Surgical History:  Procedure Laterality Date  . ANTERIOR CERVICAL DECOMP/DISCECTOMY FUSION N/A 02/23/2016   Procedure: ANTERIOR CERVICAL DECOMPRESSION/DISCECTOMY C5-6;  Surgeon: Venita Lick, MD;  Location: [facility] OR;  Service: Orthopedics;  Laterality: N/A;  Requests 2.5 hrs  . EYE SURGERY  Bilateral    cataract surgery   2014      Past Medical History:  Diagnosis Date  . Allergic rhinitis   . Anxiety   . Arthritis   . Asthma   . COPD bronchitis   . Dyspnea    with exertion  . Headache    due to neck psin  . Heart murmur    slight  no cardiology  . Mild mitral regurgitation by prior echocardiogram 12/17/06  . Osteopenia    DEXA @ gyn 11/909/10: -2.0 DEXA @ gyn 11/20/10: -1.8 DEXA @ gyn 11/20/12: -2.2  . Shingles 2012      Review of Systems  Constitutional: Negative for chills and fever.  Respiratory: Negative for chest tightness and shortness of breath.   Cardiovascular: Negative for chest pain,  palpitations and leg swelling.  Psychiatric/Behavioral: Positive for dysphoric mood. Negative for self-injury, sleep disturbance and suicidal ideas. The patient is nervous/anxious. The patient is not hyperactive.       Objective:    BP 122/80 (BP Location: Left Arm, Patient Position: Sitting, Cuff Size: Normal)   Pulse (!) 53   Temp 97.9 F (36.6 C) (Oral)   Resp 16   Ht 5\' 2"  (1.575 m)   Wt 131 lb 1.9 oz (59.5 kg)   SpO2 98%   BMI 23.98 kg/m  Nursing note and vital signs reviewed.  Physical Exam  Constitutional: She is oriented to person, place, and time. She appears well-developed and well-nourished. No distress.  Cardiovascular: Normal rate, regular rhythm, normal heart sounds and intact distal pulses.   Pulmonary/Chest: Effort normal and breath sounds normal.  Musculoskeletal:  Left thumb - no obvious discoloration or edema with small cyst noted on the posterior aspect of the interphalangeal joint which is mobile and slightly tender to the touch. Range of motion restricted secondary to discomfort. Capillary refill pulses are intact and appropriate.  Neurological: She is alert and oriented to person, place, and time.  Skin: Skin is warm and dry.  Psychiatric: Her behavior is normal. Judgment and thought content normal. She exhibits a depressed mood.       Assessment & Plan:   Problem List Items Addressed This Visit      Musculoskeletal and Integument   Thumb lesion    Symptoms and exam consistent with a cyst located on the proximal interphalangeal joint of the thumb. Treat conservatively with ice and Pennsaid. Aware of rash with oral, will try localized. Instructed to stop if rash develops. If symptoms worsen or do not improve consider ultrasound and possible drainage pending ultrasound results.        Other   Grief - Primary    New-onset grief secondary to loss of her cat she had to put to sleep. Discussed importance of the grieving process and does not appear to have  complicated grieving with information provided in the after visit summary. Advised counseling and follow-up with friends and family for comfort. Denies suicidal ideations. Follow-up as needed.          I am having Ms. Tilly maintain her multivitamin with minerals, ibandronate, estrogen (conjugated)-medroxyprogesterone, albuterol, albuterol, theophylline, tiotropium, Calcium Carbonate-Vitamin D (CALCIUM 500 + D PO), oxyCODONE-acetaminophen, ondansetron, methocarbamol, and SYMBICORT.   Follow-up: Return if symptoms worsen or fail to improve.  Jeanine Luz, FNP

## 2016-08-27 NOTE — Assessment & Plan Note (Addendum)
Symptoms and exam consistent with a cyst located on the proximal interphalangeal joint of the thumb. Treat conservatively with ice and Pennsaid. Aware of rash with oral, will try localized. Instructed to stop if rash develops. If symptoms worsen or do not improve consider ultrasound and possible drainage pending ultrasound results.

## 2016-08-27 NOTE — Telephone Encounter (Signed)
Left patient vm to make appt  °

## 2016-08-27 NOTE — Patient Instructions (Addendum)
Thank you for choosing [facility].  SUMMARY AND INSTRUCTIONS:  Sorry to hear about [facility].   Recommend continuing with the grieving process and working through things with talking with others including preachers and friends as needed.  If your mood remains depressed or does not improve in the next month please let us know.  For your thumb - Pennsaid about pinky sized area twice daily.  If worsening follow up with Dr. Jordan Likes or Dr. Katrinka Blazing for potential imaging.   Medication:  Your prescription(s) have been submitted to your pharmacy or been printed and provided for you. Please take as directed and contact our office if you believe you are having problem(s) with the medication(s) or have any questions.  Follow up:  If your symptoms worsen or fail to improve, please contact our office for further instruction, or in case of emergency go directly to the emergency room at the closest medical facility.    Complicated Grieving Grief is a normal response to the death of someone close to you. Feelings of fear, anger, and guilt can affect almost everyone who loses a loved one. It is also common to have symptoms of depression while you are grieving. These include problems with sleep, loss of appetite, and lack of energy. They may last for weeks or months after a loss. Complicated grief is different from normal grief or depression. Normal grieving involves sadness and feelings of loss, but these feelings are not constant. Complicated grief is a constant and severe type of grief. It interferes with your ability to function normally. It may last for several months to a year or longer. Complicated grief may require treatment from a mental health care provider. What are the causes? It is not known why some people continue to struggle with grief and others do not. You may be at higher risk for complicated grief if:  The death of your loved one was sudden or unexpected.  The death of your loved  one was due to a violent event.  Your loved one committed suicide.  Your loved one was a child or a young person.  You were very close to or dependent on the loved one.  You have a history of depression.  What are the signs or symptoms? Signs and symptoms of complicated grief may include:  Feeling disbelief or numbness.  Being unable to enjoy good memories of your loved one.  Needing to avoid anything that reminds you of your loved one.  Being unable to stop thinking about the death.  Feeling intense anger or guilt.  Feeling alone and hopeless.  Feeling that your life is meaningless and empty.  Losing the desire to live.  How is this diagnosed? Your health care provider may diagnose complicated grief if:  You have constant symptoms of grief for 6-12 months or longer.  Your symptoms are interfering with your ability to live your life.  Your health care provider may want you to see a mental health care provider. Many symptoms of depression are similar to the symptoms of complicated grief. It is important to be evaluated for complicated grief along with other mental health conditions. How is this treated? Talk therapy with a mental health provider is the most common treatment for complicated grief. During therapy, you will learn healthy ways to cope with the loss of your loved one. In some cases, your mental health care provider may also recommend antidepressant medicines. Follow these instructions at home:  Take care of yourself. ? Eat regular meals  and maintain a healthy diet. Eat plenty of fruits, vegetables, and whole grains. ? Try to get some exercise each day. ? Keep regular hours for sleep. Try to get at least 8 hours of sleep each night.  Do not use drugs or alcohol to ease your symptoms.  Take medicines only as directed by your health care provider.  Spend time with friends and loved ones.  Consider joining a grief (bereavement) support group to help you deal  with your loss.  Keep all follow-up visits as directed by your health care provider. This is important. Contact a health care provider if:  Your symptoms keep you from functioning normally.  Your symptoms do not get better with treatment. Get help right away if:  You have serious thoughts of hurting yourself or someone else.  You have suicidal feelings. This information is not intended to replace advice given to you by your health care provider. Make sure you discuss any questions you have with your health care provider. Document Released: 12/25/2004 Document Revised: 06/02/2015 Document Reviewed: 06/04/2013 Elsevier Interactive Patient Education  [employer].

## 2016-08-27 NOTE — Assessment & Plan Note (Signed)
New-onset grief secondary to loss of her cat she had to put to sleep. Discussed importance of the grieving process and does not appear to have complicated grieving with information provided in the after visit summary. Advised counseling and follow-up with friends and family for comfort. Denies suicidal ideations. Follow-up as needed.

## 2016-09-21 ENCOUNTER — Other Ambulatory Visit: Payer: Self-pay | Admitting: Internal Medicine

## 2016-12-11 ENCOUNTER — Other Ambulatory Visit: Payer: Self-pay | Admitting: Internal Medicine

## 2017-01-03 ENCOUNTER — Encounter: Payer: Self-pay | Admitting: Internal Medicine

## 2017-01-03 ENCOUNTER — Ambulatory Visit: Payer: BLUE CROSS/BLUE SHIELD | Admitting: Internal Medicine

## 2017-01-03 DIAGNOSIS — J4541 Moderate persistent asthma with (acute) exacerbation: Secondary | ICD-10-CM | POA: Diagnosis not present

## 2017-01-03 MED ORDER — DOXYCYCLINE HYCLATE 100 MG PO TABS: 100.0000 mg | ORAL_TABLET | Freq: Two times a day (BID) | ORAL | 0 refills | Status: DC

## 2017-01-03 NOTE — Patient Instructions (Signed)
Printed script to hold for doxycycline if needed  If you feel like experimenting, you could set the Spiriva aside to see if you are needing it.  Hope you feel better quickly Please call if we can help

## 2017-01-03 NOTE — Progress Notes (Signed)
HPI F former smoker followed for allergic rhinitis, asthma 6MWT 01/22/11-99%, 99%, 97%, 456 meters Not limited by oxygenation -CY PFT 01/22/2011-mild obstructive airways disease a slight response to bronchodilator, air trapping, normal diffusion. FEV1 2.18/95%, FEV1/FVC 0.65, FEF 25-75% 1.19/44%. RV 120%, DLCO 91% Office Spirometry 12/27/14- -------------------------------------------------------------------------- [birth year] year old female former smoker followed for allergic rhinitis, asthma/COPD FOLLOWS FOR:Pt state her breathing has been doing good being on Theo. Pt is due for neck surgery in January 2018. Has felt breathing much better controlled. No longer runs. [facility] suggested we try a LABA/LAMA to replace both Dulera and Spiriva. CXR 12/27/2014 IMPRESSION: 1. Stable mildly hyperinflated lungs, indicating a nonspecific obstructive lung disease. 2. Minimal scarring versus atelectasis at the right lung base. Otherwise no active cardiopulmonary disease.  01/03/17- 61 year-old female former smoker followed for allergic rhinitis, asthma/COPD --1 year f/u, has been having chest congestion, nasal congestion,cough with brownish color sputum Theophylline 400, Symbicort 160, ProAir HFA, neb albuterol, Spiriva Is a mild cold but has not needed to increase her asthma medicine.  Only occasional need for rescue inhaler.  Sputum is becoming brown but no fever.  ROS-see HPI + = positive Constitutional:   No-   weight loss, night sweats, fevers, chills, fatigue, lassitude. HEENT:   No-  headaches, difficulty swallowing, tooth/dental problems, sore throat,       No-  sneezing, itching, ear ache, nasal congestion, post nasal drip,  CV:  No-   chest pain, orthopnea, PND, swelling in lower extremities, anasarca, dizziness, palpitations Resp: +shortness of breath with exertion .           + sign   productive cough,  No- non-productive cough,  No- coughing up of blood.            +   change in color of mucus.  No- wheezing.   Skin: No-   rash or lesions. GI:  No-   heartburn, indigestion, abdominal pain, nausea, vomiting,  GU:  MS:  No-   joint pain or swelling.  Neuro-     nothing unusual Psych:  No- change in mood or affect. No depression or anxiety.  No memory loss.  OBJ General- Alert, Oriented, Affect-appropriate, Distress- none acute, trim Skin- rash-none, lesions- none, excoriation- none Lymphadenopathy- none Head- atraumatic            Eyes- Gross vision intact, PERRLA, conjunctivae clear secretions            Ears- Hearing, canals-normal            Nose- Clear, no-Septal dev, mucus, polyps, erosion, perforation             Throat- Mallampati II , mucosa clear , drainage- none, tonsils- atrophic Neck- flexible , trachea midline, no stridor , thyroid nl, carotid no bruit Chest - symmetrical excursion , unlabored           Heart/CV- RRR , no murmur , no gallop  , no rub, nl s1 s2                           - JVD- none , edema- none, stasis changes- none, varices- none           Lung- Unlabored, + breath sounds are coarse, wheeze -none, cough- none , dullness-none, rub- none           Chest wall-  Abd-  Br/ Gen/ Rectal- Not done, not indicated Extrem- cyanosis-  none, clubbing, none, atrophy- none, strength- nl Neuro- grossly intact to observation

## 2017-01-05 DIAGNOSIS — J4541 Moderate persistent asthma with (acute) exacerbation: Secondary | ICD-10-CM | POA: Insufficient documentation

## 2017-01-05 NOTE — Assessment & Plan Note (Signed)
Acute exacerbation with a bronchitis component Plan-medication review, stay well-hydrated, add doxycycline to hold for now

## 2017-01-24 ENCOUNTER — Ambulatory Visit (INDEPENDENT_AMBULATORY_CARE_PROVIDER_SITE_OTHER): Payer: BLUE CROSS/BLUE SHIELD | Admitting: Internal Medicine

## 2017-01-24 ENCOUNTER — Other Ambulatory Visit (INDEPENDENT_AMBULATORY_CARE_PROVIDER_SITE_OTHER): Payer: BLUE CROSS/BLUE SHIELD

## 2017-01-24 ENCOUNTER — Encounter: Payer: Self-pay | Admitting: Internal Medicine

## 2017-01-24 VITALS — BP 116/70 | HR 83 | Temp 98.3°F | Ht 62.5 in | Wt 133.0 lb

## 2017-01-24 DIAGNOSIS — J4540 Moderate persistent asthma, uncomplicated: Secondary | ICD-10-CM | POA: Diagnosis not present

## 2017-01-24 DIAGNOSIS — Z0000 Encounter for general adult medical examination without abnormal findings: Secondary | ICD-10-CM | POA: Diagnosis not present

## 2017-01-24 LAB — COMPREHENSIVE METABOLIC PANEL
ALK PHOS: 60 U/L (ref 39–117)
ALT: 13 U/L (ref 0–35)
AST: 16 U/L (ref 0–37)
Albumin: 4.4 g/dL (ref 3.5–5.2)
BILIRUBIN TOTAL: 0.4 mg/dL (ref 0.2–1.2)
BUN: 18 mg/dL (ref 6–23)
CO2: 30 meq/L (ref 19–32)
Calcium: 9.3 mg/dL (ref 8.4–10.5)
Chloride: 105 mEq/L (ref 96–112)
Creatinine, Ser: 0.75 mg/dL (ref 0.40–1.20)
GFR: 83.44 mL/min (ref >60.00)
GLUCOSE: 96 mg/dL (ref 70–99)
POTASSIUM: 3.9 meq/L (ref 3.5–5.1)
SODIUM: 141 meq/L (ref 135–145)
TOTAL PROTEIN: 6.6 g/dL (ref 6.0–8.3)

## 2017-01-24 LAB — CBC
HEMATOCRIT: 38.7 % (ref 36.0–46.0)
HEMOGLOBIN: 12.3 g/dL (ref 12.0–15.0)
MCHC: 31.7 g/dL (ref 30.0–36.0)
MCV: 79.6 fl (ref 78.0–100.0)
PLATELETS: 255 10*3/uL (ref 150.0–400.0)
RBC: 4.86 Mil/uL (ref 3.87–5.11)
RDW: 13.8 % (ref 11.5–15.5)
WBC: 5.4 10*3/uL (ref 4.0–10.5)

## 2017-01-24 LAB — LIPID PANEL
CHOL/HDL RATIO: 2
Cholesterol: 176 mg/dL (ref 0–200)
HDL: 92.5 mg/dL (ref >39.00)
LDL Cholesterol: 72 mg/dL (ref 0–99)
NONHDL: 83.63
Triglycerides: 57 mg/dL (ref 0.0–149.0)
VLDL: 11.4 mg/dL (ref 0.0–40.0)

## 2017-01-24 NOTE — Patient Instructions (Signed)
We will check the labs today. Think about taking turmeric for the joints.   Health Maintenance, Female Adopting a healthy lifestyle and getting preventive care can go a long way to promote health and wellness. Talk with your health care provider about what schedule of regular examinations is right for you. This is a good chance for you to check in with your provider about disease prevention and staying healthy. In between checkups, there are plenty of things you can do on your own. Experts have done a lot of research about which lifestyle changes and preventive measures are most likely to keep you healthy. Ask your health care provider for more information. Weight and diet Eat a healthy diet  Be sure to include plenty of vegetables, fruits, low-fat dairy products, and lean protein.  Do not eat a lot of foods high in solid fats, added sugars, or salt.  Get regular exercise. This is one of the most important things you can do for your health. ? Most adults should exercise for at least 150 minutes each week. The exercise should increase your heart rate and make you sweat (moderate-intensity exercise). ? Most adults should also do strengthening exercises at least twice a week. This is in addition to the moderate-intensity exercise.  Maintain a healthy weight  Body mass index (BMI) is a measurement that can be used to identify possible weight problems. It estimates body fat based on height and weight. Your health care provider can help determine your BMI and help you achieve or maintain a healthy weight.  For females 59 years of age and older: ? A BMI below 18.5 is considered underweight. ? A BMI of 18.5 to 24.9 is normal. ? A BMI of 25 to 29.9 is considered overweight. ? A BMI of 30 and above is considered obese.  Watch levels of cholesterol and blood lipids  You should start having your blood tested for lipids and cholesterol at 62 years of age, then have this test every 5 years.  You may  need to have your cholesterol levels checked more often if: ? Your lipid or cholesterol levels are high. ? You are older than 62 years of age. ? You are at high risk for heart disease.  Cancer screening Lung Cancer  Lung cancer screening is recommended for adults 53-61 years old who are at high risk for lung cancer because of a history of smoking.  A yearly low-dose CT scan of the lungs is recommended for people who: ? Currently smoke. ? Have quit within the past 15 years. ? Have at least a 30-pack-year history of smoking. A pack year is smoking an average of one pack of cigarettes a day for 1 year.  Yearly screening should continue until it has been 15 years since you quit.  Yearly screening should stop if you develop a health problem that would prevent you from having lung cancer treatment.  Breast Cancer  Practice breast self-awareness. This means understanding how your breasts normally appear and feel.  It also means doing regular breast self-exams. Let your health care provider know about any changes, no matter how small.  If you are in your 20s or 30s, you should have a clinical breast exam (CBE) by a health care provider every 1-3 years as part of a regular health exam.  If you are 20 or older, have a CBE every year. Also consider having a breast X-ray (mammogram) every year.  If you have a family history of breast cancer, talk  to your health care provider about genetic screening.  If you are at high risk for breast cancer, talk to your health care provider about having an MRI and a mammogram every year.  Breast cancer gene (BRCA) assessment is recommended for women who have family members with BRCA-related cancers. BRCA-related cancers include: ? Breast. ? Ovarian. ? Tubal. ? Peritoneal cancers.  Results of the assessment will determine the need for genetic counseling and BRCA1 and BRCA2 testing.  Cervical Cancer Your health care provider may recommend that you be  screened regularly for cancer of the pelvic organs (ovaries, uterus, and vagina). This screening involves a pelvic examination, including checking for microscopic changes to the surface of your cervix (Pap test). You may be encouraged to have this screening done every 3 years, beginning at age 42.  For women ages 66-65, health care providers may recommend pelvic exams and Pap testing every 3 years, or they may recommend the Pap and pelvic exam, combined with testing for human papilloma virus (HPV), every 5 years. Some types of HPV increase your risk of cervical cancer. Testing for HPV may also be done on women of any age with unclear Pap test results.  Other health care providers may not recommend any screening for nonpregnant women who are considered low risk for pelvic cancer and who do not have symptoms. Ask your health care provider if a screening pelvic exam is right for you.  If you have had past treatment for cervical cancer or a condition that could lead to cancer, you need Pap tests and screening for cancer for at least 20 years after your treatment. If Pap tests have been discontinued, your risk factors (such as having a new sexual partner) need to be reassessed to determine if screening should resume. Some women have medical problems that increase the chance of getting cervical cancer. In these cases, your health care provider may recommend more frequent screening and Pap tests.  Colorectal Cancer  This type of cancer can be detected and often prevented.  Routine colorectal cancer screening usually begins at 62 years of age and continues through 62 years of age.  Your health care provider may recommend screening at an earlier age if you have risk factors for colon cancer.  Your health care provider may also recommend using home test kits to check for hidden blood in the stool.  A small camera at the end of a tube can be used to examine your colon directly (sigmoidoscopy or colonoscopy).  This is done to check for the earliest forms of colorectal cancer.  Routine screening usually begins at age 44.  Direct examination of the colon should be repeated every 5-10 years through 62 years of age. However, you may need to be screened more often if early forms of precancerous polyps or small growths are found.  Skin Cancer  Check your skin from head to toe regularly.  Tell your health care provider about any new moles or changes in moles, especially if there is a change in a mole's shape or color.  Also tell your health care provider if you have a mole that is larger than the size of a pencil eraser.  Always use sunscreen. Apply sunscreen liberally and repeatedly throughout the day.  Protect yourself by wearing long sleeves, pants, a wide-brimmed hat, and sunglasses whenever you are outside.  Heart disease, diabetes, and high blood pressure  High blood pressure causes heart disease and increases the risk of stroke. High blood pressure is more  likely to develop in: ? People who have blood pressure in the high end of the normal range (130-139/85-89 mm Hg). ? People who are overweight or obese. ? People who are African American.  If you are 36-78 years of age, have your blood pressure checked every 3-5 years. If you are 46 years of age or older, have your blood pressure checked every year. You should have your blood pressure measured twice-once when you are at a hospital or clinic, and once when you are not at a hospital or clinic. Record the average of the two measurements. To check your blood pressure when you are not at a hospital or clinic, you can use: ? An automated blood pressure machine at a pharmacy. ? A home blood pressure monitor.  If you are between 20 years and 57 years old, ask your health care provider if you should take aspirin to prevent strokes.  Have regular diabetes screenings. This involves taking a blood sample to check your fasting blood sugar level. ? If  you are at a normal weight and have a low risk for diabetes, have this test once every three years after 62 years of age. ? If you are overweight and have a high risk for diabetes, consider being tested at a younger age or more often. Preventing infection Hepatitis B  If you have a higher risk for hepatitis B, you should be screened for this virus. You are considered at high risk for hepatitis B if: ? You were born in a country where hepatitis B is common. Ask your health care provider which countries are considered high risk. ? Your parents were born in a high-risk country, and you have not been immunized against hepatitis B (hepatitis B vaccine). ? You have HIV or AIDS. ? You use needles to inject street drugs. ? You live with someone who has hepatitis B. ? You have had sex with someone who has hepatitis B. ? You get hemodialysis treatment. ? You take certain medicines for conditions, including cancer, organ transplantation, and autoimmune conditions.  Hepatitis C  Blood testing is recommended for: ? Everyone born from [birth year] through 1965. ? Anyone with known risk factors for hepatitis C.  Sexually transmitted infections (STIs)  You should be screened for sexually transmitted infections (STIs) including gonorrhea and chlamydia if: ? You are sexually active and are younger than 62 years of age. ? You are older than 62 years of age and your health care provider tells you that you are at risk for this type of infection. ? Your sexual activity has changed since you were last screened and you are at an increased risk for chlamydia or gonorrhea. Ask your health care provider if you are at risk.  If you do not have HIV, but are at risk, it may be recommended that you take a prescription medicine daily to prevent HIV infection. This is called pre-exposure prophylaxis (PrEP). You are considered at risk if: ? You are sexually active and do not regularly use condoms or know the HIV status of your  partner(s). ? You take drugs by injection. ? You are sexually active with a partner who has HIV.  Talk with your health care provider about whether you are at high risk of being infected with HIV. If you choose to begin PrEP, you should first be tested for HIV. You should then be tested every 3 months for as long as you are taking PrEP. Pregnancy  If you are premenopausal and you may  become pregnant, ask your health care provider about preconception counseling.  If you may become pregnant, take 400 to 800 micrograms (mcg) of folic acid every day.  If you want to prevent pregnancy, talk to your health care provider about birth control (contraception). Osteoporosis and menopause  Osteoporosis is a disease in which the bones lose minerals and strength with aging. This can result in serious bone fractures. Your risk for osteoporosis can be identified using a bone density scan.  If you are 40 years of age or older, or if you are at risk for osteoporosis and fractures, ask your health care provider if you should be screened.  Ask your health care provider whether you should take a calcium or vitamin D supplement to lower your risk for osteoporosis.  Menopause may have certain physical symptoms and risks.  Hormone replacement therapy may reduce some of these symptoms and risks. Talk to your health care provider about whether hormone replacement therapy is right for you. Follow these instructions at home:  Schedule regular health, dental, and eye exams.  Stay current with your immunizations.  Do not use any tobacco products including cigarettes, chewing tobacco, or electronic cigarettes.  If you are pregnant, do not drink alcohol.  If you are breastfeeding, limit how much and how often you drink alcohol.  Limit alcohol intake to no more than 1 drink per day for nonpregnant women. One drink equals 12 ounces of beer, 5 ounces of wine, or 1 ounces of hard liquor.  Do not use street  drugs.  Do not share needles.  Ask your health care provider for help if you need support or information about quitting drugs.  Tell your health care provider if you often feel depressed.  Tell your health care provider if you have ever been abused or do not feel safe at home. This information is not intended to replace advice given to you by your health care provider. Make sure you discuss any questions you have with your health care provider. Document Released: 07/10/2010 Document Revised: 06/02/2015 Document Reviewed: 09/28/2014 Elsevier Interactive Patient Education  [employer].

## 2017-01-24 NOTE — Progress Notes (Signed)
   Subjective:    Patient ID: Jessica Richards, female    DOB: 10/21/1955, 62 y.o.   MRN: [id number]  HPI  The patient is a 62 YO female coming in for physical.   PMH, [facility], social history reviewed and updated.   Pap smear and mammogram and dexa with gyn. Up to date.  Review of Systems  Constitutional: Negative.   HENT: Negative.   Eyes: Negative.   Respiratory: Negative for cough, chest tightness and shortness of breath.   Cardiovascular: Negative for chest pain, palpitations and leg swelling.  Gastrointestinal: Negative for abdominal distention, abdominal pain, constipation, diarrhea, nausea and vomiting.  Musculoskeletal: Negative.   Skin: Negative.   Neurological: Negative.   Psychiatric/Behavioral: Negative.       Objective:   Physical Exam  Constitutional: She is oriented to person, place, and time. She appears well-developed and well-nourished.  HENT:  Head: Normocephalic and atraumatic.  Eyes: EOM are normal.  Neck: Normal range of motion.  Cardiovascular: Normal rate and regular rhythm.  Pulmonary/Chest: Effort normal and breath sounds normal. No respiratory distress. She has no wheezes. She has no rales.  Abdominal: Soft. Bowel sounds are normal. She exhibits no distension. There is no tenderness. There is no rebound.  Musculoskeletal: She exhibits no edema.  Neurological: She is alert and oriented to person, place, and time. Coordination normal.  Skin: Skin is warm and dry.  Psychiatric: She has a normal mood and affect.   Vitals:   01/24/17 1435  BP: 116/70  Pulse: 83  Temp: 98.3 F (36.8 C)  TempSrc: Oral  SpO2: 99%  Weight: 133 lb (60.3 kg)  Height: 5' 2.5" (1.588 m)      Assessment & Plan:

## 2017-01-25 NOTE — Assessment & Plan Note (Signed)
Seeing pulmonary and they are gradually decreasing therapy with some success.

## 2017-01-25 NOTE — Assessment & Plan Note (Signed)
Colonoscopy due later this year, pap and mammogram and dexa up to date with gyn. Flu and pneumonia up to date. Tetanus up to date. Counseled about sun safety and mole surveillance. Given screening recommendations.

## 2017-04-29 ENCOUNTER — Other Ambulatory Visit: Payer: Self-pay | Admitting: Internal Medicine

## 2017-05-14 LAB — HM PAP SMEAR

## 2017-05-14 LAB — HM MAMMOGRAPHY

## 2017-06-15 ENCOUNTER — Other Ambulatory Visit: Payer: Self-pay | Admitting: Internal Medicine

## 2017-08-08 ENCOUNTER — Encounter: Payer: Self-pay | Admitting: Gastroenterology

## 2017-08-21 ENCOUNTER — Telehealth: Payer: Self-pay | Admitting: Gastroenterology

## 2017-08-21 NOTE — Telephone Encounter (Signed)
ok 

## 2017-10-22 ENCOUNTER — Other Ambulatory Visit: Payer: Self-pay | Admitting: Internal Medicine

## 2017-11-27 ENCOUNTER — Other Ambulatory Visit: Payer: Self-pay | Admitting: Internal Medicine

## 2018-01-06 ENCOUNTER — Encounter: Payer: Self-pay | Admitting: Internal Medicine

## 2018-01-06 ENCOUNTER — Ambulatory Visit: Payer: BLUE CROSS/BLUE SHIELD | Admitting: Internal Medicine

## 2018-01-06 DIAGNOSIS — M8580 Other specified disorders of bone density and structure, unspecified site: Secondary | ICD-10-CM

## 2018-01-06 DIAGNOSIS — J4540 Moderate persistent asthma, uncomplicated: Secondary | ICD-10-CM | POA: Diagnosis not present

## 2018-01-06 NOTE — Assessment & Plan Note (Signed)
She has not needed systemic steroids in a long time now-discussed.

## 2018-01-06 NOTE — Patient Instructions (Signed)
Ok to  continue current meds  Please call if we can help   

## 2018-01-06 NOTE — Progress Notes (Signed)
HPI F former smoker followed for allergic rhinitis, asthma 6MWT 01/22/11-99%, 99%, 97%, 456 meters Not limited by oxygenation -CY PFT 01/22/2011-mild obstructive airways disease a slight response to bronchodilator, air trapping, normal diffusion. FEV1 2.18/95%, FEV1/FVC 0.65, FEF 25-75% 1.19/44%. RV 120%, DLCO 91% Office Spirometry 12/27/14- -------------------------------------------------------------------------- 01/03/17- 62 year-old female former smoker followed for allergic rhinitis, asthma/COPD --1 year f/u, has been having chest congestion, nasal congestion,cough with brownish color sputum Theophylline 400, Symbicort 160, ProAir HFA, neb albuterol, Spiriva Is a mild cold but has not needed to increase her asthma medicine.  Only occasional need for rescue inhaler.  Sputum is becoming brown but no fever.  01/06/2018- 62 year-old female former smoker followed for allergic rhinitis, asthma/COPD --1 year f/u, has been having chest congestion, nasal congestion,cough with brownish color sputum Theophylline 400, Symbicort 160, ProAir HFA, neb albuterol, Spiriva -----Asthma: Sore throat, congestion-chest and sinus. Denies any body aches or chillls.  Here today with her twin sister Clydie BraunKaren. She feels she is doing well with her medications.  Incidental mild cold in the last couple of days to be treated conservatively. Diet and exercise reduced weight and she is exercising regularly.  No new issues.  ROS-see HPI + = positive Constitutional:   No-   weight loss, night sweats, fevers, chills, fatigue, lassitude. HEENT:   No-  headaches, difficulty swallowing, tooth/dental problems, sore throat,       No-  sneezing, itching, ear ache, nasal congestion, post nasal drip,  CV:  No-   chest pain, orthopnea, PND, swelling in lower extremities, anasarca, dizziness, palpitations Resp: +shortness of breath with exertion .           + sign   productive cough,  No- non-productive cough,  No- coughing up of  blood.           +   change in color of mucus.  No- wheezing.   Skin: No-   rash or lesions. GI:  No-   heartburn, indigestion, abdominal pain, nausea, vomiting,  GU:  MS:  No-   joint pain or swelling.  Neuro-     nothing unusual Psych:  No- change in mood or affect. No depression or anxiety.  No memory loss.  OBJ General- Alert, Oriented, Affect-appropriate, Distress- none acute, trim Skin- rash-none, lesions- none, excoriation- none Lymphadenopathy- none Head- atraumatic            Eyes- Gross vision intact, PERRLA, conjunctivae clear secretions            Ears- Hearing, canals-normal            Nose- Clear, no-Septal dev, mucus, polyps, erosion, perforation             Throat- Mallampati II , mucosa clear , drainage- none, tonsils- atrophic Neck- flexible , trachea midline, no stridor , thyroid nl, carotid no bruit Chest - symmetrical excursion , unlabored           Heart/CV- RRR , no murmur , no gallop  , no rub, nl s1 s2                           - JVD- none , edema- none, stasis changes- none, varices- none           Lung- Unlabored, clear, wheeze -none, cough- none , dullness-none, rub- none           Chest wall-  Abd-  Br/ Gen/ Rectal- Not done, not indicated Extrem-  cyanosis- none, clubbing, none, atrophy- none, strength- nl Neuro- grossly intact to observation

## 2018-01-06 NOTE — Assessment & Plan Note (Signed)
She is controlled on medication, without exacerbation.  Minor cold treated symptomatically. Plan- continue current meds. Suggest update pneumonia vaccine at age 62.

## 2018-01-17 ENCOUNTER — Other Ambulatory Visit: Payer: Self-pay | Admitting: Internal Medicine

## 2018-01-30 ENCOUNTER — Encounter: Payer: BLUE CROSS/BLUE SHIELD | Admitting: Internal Medicine

## 2018-02-04 MED ORDER — ALBUTEROL SULFATE (2.5 MG/3ML) 0.083% IN NEBU: INHALATION_SOLUTION | RESPIRATORY_TRACT | 3 refills | Status: DC

## 2018-02-06 ENCOUNTER — Other Ambulatory Visit (INDEPENDENT_AMBULATORY_CARE_PROVIDER_SITE_OTHER): Payer: BLUE CROSS/BLUE SHIELD

## 2018-02-06 ENCOUNTER — Encounter: Payer: Self-pay | Admitting: Internal Medicine

## 2018-02-06 ENCOUNTER — Ambulatory Visit (INDEPENDENT_AMBULATORY_CARE_PROVIDER_SITE_OTHER): Payer: BLUE CROSS/BLUE SHIELD | Admitting: Internal Medicine

## 2018-02-06 VITALS — BP 120/78 | HR 52 | Temp 97.8°F | Ht 62.5 in | Wt 124.0 lb

## 2018-02-06 DIAGNOSIS — J4540 Moderate persistent asthma, uncomplicated: Secondary | ICD-10-CM | POA: Diagnosis not present

## 2018-02-06 DIAGNOSIS — Z23 Encounter for immunization: Secondary | ICD-10-CM | POA: Diagnosis not present

## 2018-02-06 DIAGNOSIS — Z0000 Encounter for general adult medical examination without abnormal findings: Secondary | ICD-10-CM

## 2018-02-06 DIAGNOSIS — M8580 Other specified disorders of bone density and structure, unspecified site: Secondary | ICD-10-CM | POA: Diagnosis not present

## 2018-02-06 LAB — LIPID PANEL
CHOLESTEROL: 176 mg/dL (ref 0–200)
HDL: 81.6 mg/dL (ref >39.00)
LDL Cholesterol: 80 mg/dL (ref 0–99)
NonHDL: 94.32
Total CHOL/HDL Ratio: 2
Triglycerides: 74 mg/dL (ref 0.0–149.0)
VLDL: 14.8 mg/dL (ref 0.0–40.0)

## 2018-02-06 LAB — COMPREHENSIVE METABOLIC PANEL
ALBUMIN: 4.2 g/dL (ref 3.5–5.2)
ALT: 16 U/L (ref 0–35)
AST: 16 U/L (ref 0–37)
Alkaline Phosphatase: 56 U/L (ref 39–117)
BUN: 13 mg/dL (ref 6–23)
CO2: 27 mEq/L (ref 19–32)
Calcium: 9.2 mg/dL (ref 8.4–10.5)
Chloride: 105 mEq/L (ref 96–112)
Creatinine, Ser: 0.71 mg/dL (ref 0.40–1.20)
GFR: 83.35 mL/min (ref >60.00)
Glucose, Bld: 89 mg/dL (ref 70–99)
Potassium: 4.2 mEq/L (ref 3.5–5.1)
Sodium: 139 mEq/L (ref 135–145)
Total Bilirubin: 0.4 mg/dL (ref 0.2–1.2)
Total Protein: 6.2 g/dL (ref 6.0–8.3)

## 2018-02-06 LAB — CBC
HCT: 39.1 % (ref 36.0–46.0)
Hemoglobin: 12.6 g/dL (ref 12.0–15.0)
MCHC: 32.1 g/dL (ref 30.0–36.0)
MCV: 80.7 fl (ref 78.0–100.0)
Platelets: 252 10*3/uL (ref 150.0–400.0)
RBC: 4.85 Mil/uL (ref 3.87–5.11)
RDW: 13.7 % (ref 11.5–15.5)
WBC: 5.2 10*3/uL (ref 4.0–10.5)

## 2018-02-06 NOTE — Progress Notes (Signed)
   Subjective:   Patient ID: Jessica Richards, female    DOB: [birth year], 63 y.o.   MRN: [id number]  HPI The patient is a 63 YO female coming in for physical.   PMH, FMH, social history reviewed and updated  Review of Systems  Constitutional: Negative.   HENT: Negative.   Eyes: Negative.   Respiratory: Negative for cough, chest tightness and shortness of breath.   Cardiovascular: Negative for chest pain, palpitations and leg swelling.  Gastrointestinal: Negative for abdominal distention, abdominal pain, constipation, diarrhea, nausea and vomiting.  Musculoskeletal: Negative.   Skin: Negative.   Neurological: Negative.   Psychiatric/Behavioral: Negative.     Objective:  Physical Exam Constitutional:      Appearance: She is well-developed.  HENT:     Head: Normocephalic and atraumatic.  Neck:     Musculoskeletal: Normal range of motion.  Cardiovascular:     Rate and Rhythm: Normal rate and regular rhythm.  Pulmonary:     Effort: Pulmonary effort is normal. No respiratory distress.     Breath sounds: Normal breath sounds. No wheezing or rales.  Abdominal:     General: Bowel sounds are normal. There is no distension.     Palpations: Abdomen is soft.     Tenderness: There is no abdominal tenderness. There is no rebound.  Skin:    General: Skin is warm and dry.  Neurological:     Mental Status: She is alert and oriented to person, place, and time.     Coordination: Coordination normal.     Vitals:   02/06/18 1259  BP: 120/78  Pulse: (!) 52  Temp: 97.8 F (36.6 C)  TempSrc: Oral  SpO2: 99%  Weight: 124 lb (56.2 kg)  Height: 5' 2.5" (1.588 m)    Assessment & Plan:  Shingrix and Tdap given at visit

## 2018-02-06 NOTE — Patient Instructions (Signed)

## 2018-02-07 ENCOUNTER — Encounter: Payer: Self-pay | Admitting: Internal Medicine

## 2018-02-07 NOTE — Assessment & Plan Note (Signed)
Flu shot up to date. Pneumonia up to date. Shingrix given 1st. Tetanus given. Cologuard up to date. Mammogram up to date, pap smear up to date. Counseled about sun safety and mole surveillance. Counseled about the dangers of distracted driving. Given 10 year screening recommendations.

## 2018-02-07 NOTE — Assessment & Plan Note (Signed)
Still following with pulmonary and taking theophylline and symbicort and spiriva and albuterol prn. Doing well and without flare in the last year.

## 2018-02-07 NOTE — Assessment & Plan Note (Signed)
Taking boniva and DEXA with gyn. Getting records.

## 2018-02-10 ENCOUNTER — Encounter: Payer: Self-pay | Admitting: Internal Medicine

## 2018-02-10 NOTE — Progress Notes (Signed)
Abstracted and sent to scan  

## 2018-04-14 ENCOUNTER — Ambulatory Visit: Payer: BLUE CROSS/BLUE SHIELD

## 2018-05-14 ENCOUNTER — Other Ambulatory Visit: Payer: Self-pay | Admitting: Internal Medicine

## 2018-06-23 ENCOUNTER — Ambulatory Visit (INDEPENDENT_AMBULATORY_CARE_PROVIDER_SITE_OTHER): Payer: BC Managed Care – PPO

## 2018-06-23 DIAGNOSIS — Z23 Encounter for immunization: Secondary | ICD-10-CM | POA: Diagnosis not present

## 2018-06-23 DIAGNOSIS — Z299 Encounter for prophylactic measures, unspecified: Secondary | ICD-10-CM

## 2018-08-12 ENCOUNTER — Telehealth: Payer: Self-pay | Admitting: Internal Medicine

## 2018-08-12 NOTE — Telephone Encounter (Signed)
Informed Jessica Richards that I have not received the surgical clearance fax yet gave side A fax number to refax to. Will put in MD folder to look over and sign once received

## 2018-08-12 NOTE — Telephone Encounter (Signed)
Copied from [place] [id number]. Topic: General - Call Back - No Documentation >> Aug 12, 2018  3:34 PM Erick Blinks wrote: Requestor Name/Agency: Jon Gills Call Back #: [phone] Information Requested: Surgical clearance for left hip replacement, done under spinal anesthesia  Faxed over on July 24th   Route to [place] for [employer]. For all other clinics, route to the clinic's PEC Pool.

## 2018-08-13 ENCOUNTER — Telehealth: Payer: Self-pay

## 2018-08-13 NOTE — Telephone Encounter (Signed)
Called patient and LVM to call back to schedule a surgical clearance appointment for Dr. Sharlet Salina to be able to sign off for surgery.

## 2018-08-14 ENCOUNTER — Other Ambulatory Visit: Payer: Self-pay

## 2018-08-14 ENCOUNTER — Ambulatory Visit (INDEPENDENT_AMBULATORY_CARE_PROVIDER_SITE_OTHER): Payer: BC Managed Care – PPO | Admitting: Internal Medicine

## 2018-08-14 ENCOUNTER — Encounter: Payer: Self-pay | Admitting: Internal Medicine

## 2018-08-14 VITALS — BP 112/74 | HR 48 | Temp 98.1°F | Ht 62.5 in | Wt 126.0 lb

## 2018-08-14 DIAGNOSIS — J4540 Moderate persistent asthma, uncomplicated: Secondary | ICD-10-CM | POA: Diagnosis not present

## 2018-08-14 DIAGNOSIS — Z01810 Encounter for preprocedural cardiovascular examination: Secondary | ICD-10-CM | POA: Diagnosis not present

## 2018-08-14 NOTE — Patient Instructions (Signed)
We will clear you for surgery.

## 2018-08-14 NOTE — Progress Notes (Signed)
   Subjective:   Patient ID: Jessica Richards, female    DOB: [birth year], 63 y.o.   MRN: [id number]  HPI The patient is a 63 YO female coming in for surgical clearance. She is going to have a hip replacement in the near future. She does have concurrent asthma and this is well controlled. She denies using albuterol more often than usual. Rare inhaler use and no nebulizer usage in the last 6 months. She is able to walk 3 miles daily without SOB or chest pain. Denies any chest pains or palpitations. Denies fevers or chills. She denies diarrhea or constipation.   PMH, [facility], social history reviewed and updated  Review of Systems  Constitutional: Negative.   HENT: Negative.   Eyes: Negative.   Respiratory: Negative for cough, chest tightness and shortness of breath.   Cardiovascular: Negative for chest pain, palpitations and leg swelling.  Gastrointestinal: Negative for abdominal distention, abdominal pain, constipation, diarrhea, nausea and vomiting.  Musculoskeletal: Positive for arthralgias.  Skin: Negative.   Neurological: Negative.   Psychiatric/Behavioral: Negative.     Objective:  Physical Exam Constitutional:      Appearance: She is well-developed.  HENT:     Head: Normocephalic and atraumatic.  Neck:     Musculoskeletal: Normal range of motion.     Vascular: No carotid bruit.  Cardiovascular:     Rate and Rhythm: Normal rate and regular rhythm.  Pulmonary:     Effort: Pulmonary effort is normal. No respiratory distress.     Breath sounds: Normal breath sounds. No wheezing or rales.  Abdominal:     General: Bowel sounds are normal. There is no distension.     Palpations: Abdomen is soft.     Tenderness: There is no abdominal tenderness. There is no rebound.  Skin:    General: Skin is warm and dry.  Neurological:     Mental Status: She is alert and oriented to person, place, and time.     Coordination: Coordination normal.     Vitals:   08/14/18 0754  BP: 112/74   Pulse: (!) 48  Temp: 98.1 F (36.7 C)  TempSrc: Oral  SpO2: 98%  Weight: 126 lb (57.2 kg)  Height: 5' 2.5" (1.588 m)   EKG: Rate 51, axis normal, intervals normal, sinus brady, no st or t wave changes, when compared to 2018 no significant changes  Assessment & Plan:

## 2018-08-14 NOTE — Assessment & Plan Note (Signed)
EKG done and stable from prior. She always has mild sinus bradycardia. No indication for further cardiac testing. Recent labs okay and will get pre-op labs also. Cleared as low risk for medical and cardiac for upcoming surgery. She has adequate METS.

## 2018-08-14 NOTE — Assessment & Plan Note (Signed)
Stable and no flare today. No concerns for surgery. Non-smoker. Advised to take all inhalers and meds day of surgery.

## 2018-09-24 ENCOUNTER — Other Ambulatory Visit: Payer: Self-pay | Admitting: Internal Medicine

## 2018-10-18 ENCOUNTER — Other Ambulatory Visit: Payer: Self-pay

## 2018-10-18 ENCOUNTER — Ambulatory Visit (INDEPENDENT_AMBULATORY_CARE_PROVIDER_SITE_OTHER): Payer: BC Managed Care – PPO

## 2018-10-18 DIAGNOSIS — Z23 Encounter for immunization: Secondary | ICD-10-CM | POA: Diagnosis not present

## 2018-11-01 IMAGING — RF DG C-ARM 61-120 MIN
1 series · 3 of 3 positions shown · non-contrast
Comparison: No recent prior.

CLINICAL DATA: Cervical spine surgery.

EXAM:
DG C-ARM 61-120 MIN; CERVICAL SPINE - 2-3 VIEW

[Series 1: run · 3 of 3 slices shown]
[im 1/3]
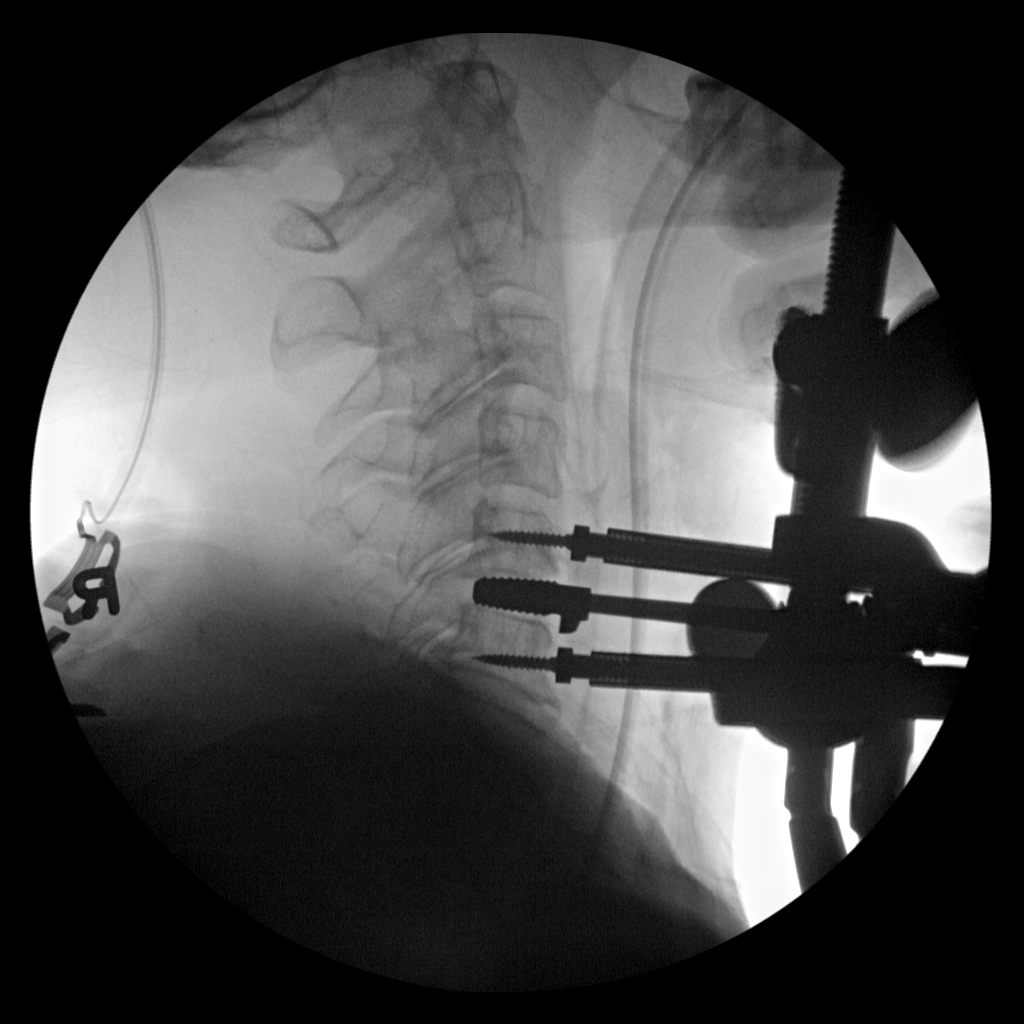
[im 2/3]
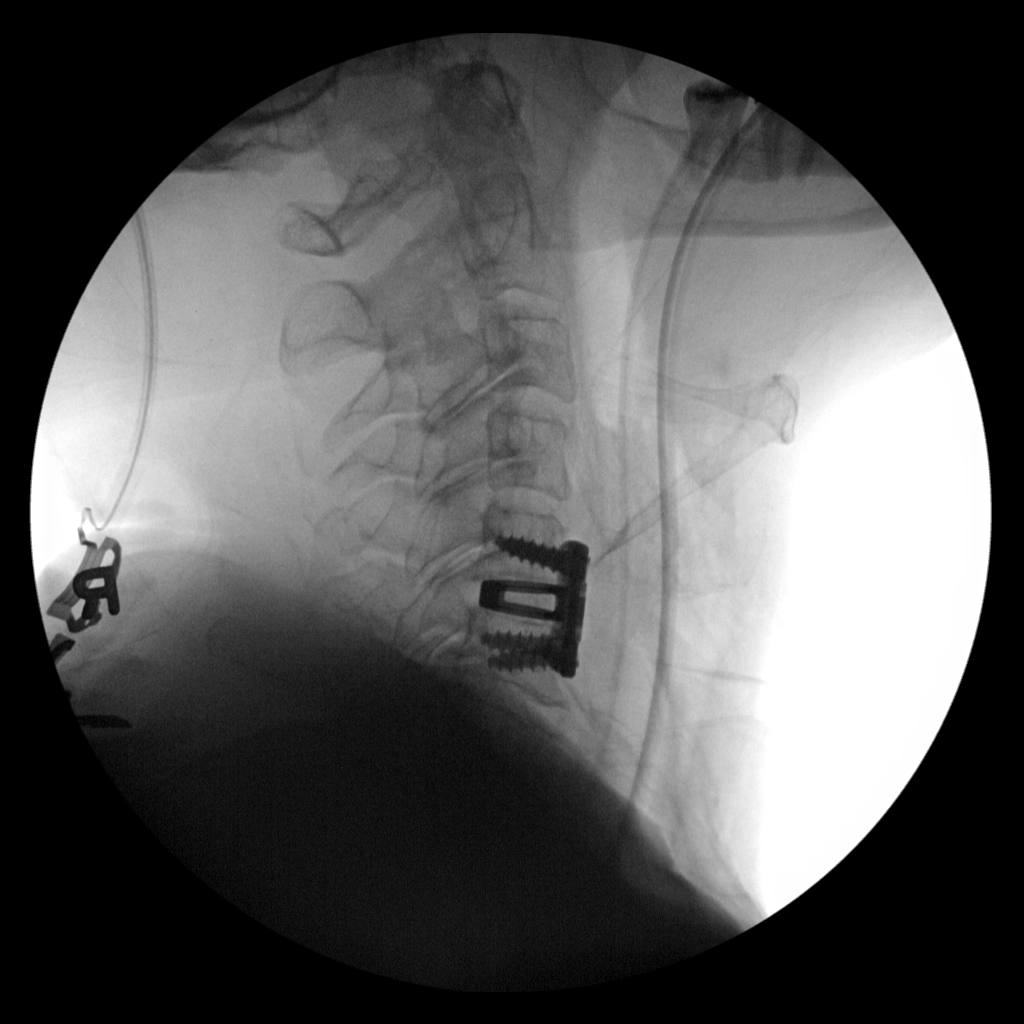
[im 3/3]
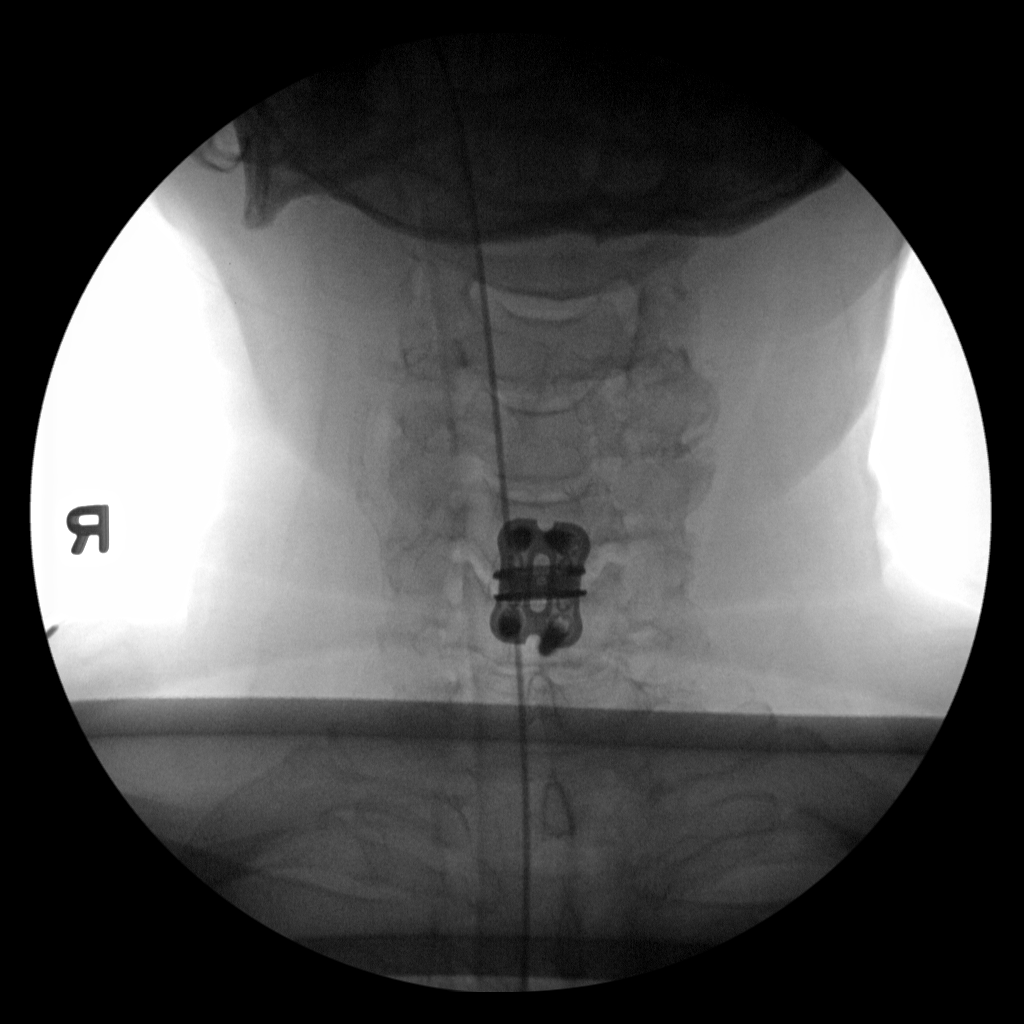

[3 of 3 positions shown; findings below may reference images not displayed]

FINDINGS: C5-C6 anterior and interbody fusion. Anatomic alignment. No acute
bony abnormality.
IMPRESSION: C5-C6 anterior and interbody fusion.

## 2018-11-06 ENCOUNTER — Other Ambulatory Visit: Payer: Self-pay | Admitting: Internal Medicine

## 2019-01-07 ENCOUNTER — Ambulatory Visit: Payer: BC Managed Care – PPO | Admitting: Internal Medicine

## 2019-01-07 ENCOUNTER — Other Ambulatory Visit: Payer: Self-pay

## 2019-01-07 ENCOUNTER — Encounter: Payer: Self-pay | Admitting: Internal Medicine

## 2019-01-07 DIAGNOSIS — J4540 Moderate persistent asthma, uncomplicated: Secondary | ICD-10-CM | POA: Diagnosis not present

## 2019-01-07 DIAGNOSIS — Z23 Encounter for immunization: Secondary | ICD-10-CM

## 2019-01-07 MED ORDER — BREZTRI AEROSPHERE 160-9-4.8 MCG/ACT IN AERO: 2.0000 | INHALATION_SPRAY | Freq: Two times a day (BID) | RESPIRATORY_TRACT | 0 refills | Status: DC

## 2019-01-07 NOTE — Assessment & Plan Note (Signed)
She feels well controlled, but still notes difference if she misses a dose of theophylline. Plan- continue current meds, but try samples of Breztri instead of Symbicort/ Spiriva for comparison

## 2019-01-07 NOTE — Progress Notes (Signed)
HPI F former smoker followed for allergic rhinitis, asthma 6MWT 01/22/11-99%, 99%, 97%, 456 meters Not limited by oxygenation -CY PFT 01/22/2011-mild obstructive airways disease a slight response to bronchodilator, air trapping, normal diffusion. FEV1 2.18/95%, FEV1/FVC 0.65, FEF 25-75% 1.19/44%. RV 120%, DLCO 91% Office Spirometry 12/27/14- --------------------------------------------------------------------------  01/06/2018- 63 year-old female former smoker followed for allergic rhinitis, asthma/COPD --1 year f/u, has been having chest congestion, nasal congestion,cough with brownish color sputum Theophylline 400, Symbicort 160, ProAir HFA, neb albuterol, Spiriva -----Asthma: Sore throat, congestion-chest and sinus. Denies any body aches or chillls.  Here today with her twin sister Santiago Glad. She feels she is doing well with her medications.  Incidental mild cold in the last couple of days to be treated conservatively. Diet and exercise reduced weight and she is exercising regularly.  No new issues.  01/07/2019-  82 year-old female former smoker followed for allergic rhinitis, asthma/COPD -----F/U Chronic asthma. Breathing is at patient's baseline. Neb albut, albuterol hfa, Spiriva, Symbicort 160, theophylline,  Feels very well controlled. Used neb only once after cat exposure. Last rescue use 1 month ago. Has tried to stay active, but slowed down by L hip replacement in August- still hurts some. Had flu vax  ROS-see HPI + = positive Constitutional:   No-   weight loss, night sweats, fevers, chills, fatigue, lassitude. HEENT:   No-  headaches, difficulty swallowing, tooth/dental problems, sore throat,       No-  sneezing, itching, ear ache, nasal congestion, post nasal drip,  CV:  No-   chest pain, orthopnea, PND, swelling in lower extremities, anasarca, dizziness, palpitations Resp: +shortness of breath with exertion .            sign   productive cough,  No- non-productive cough,  No-  coughing up of blood.              change in color of mucus.  No- wheezing.   Skin: No-   rash or lesions. GI:  No-   heartburn, indigestion, abdominal pain, nausea, vomiting,  GU:  MS:  +   joint pain or swelling.  Neuro-     nothing unusual Psych:  No- change in mood or affect. No depression or anxiety.  No memory loss.  OBJ General- Alert, Oriented, Affect-appropriate, Distress- none acute, trim Skin- rash-none, lesions- none, excoriation- none Lymphadenopathy- none Head- atraumatic            Eyes- Gross vision intact, PERRLA, conjunctivae clear secretions            Ears- Hearing, canals-normal            Nose- Clear, no-Septal dev, mucus, polyps, erosion, perforation             Throat- Mallampati II , mucosa clear , drainage- none, tonsils- atrophic Neck- flexible , trachea midline, no stridor , thyroid nl, carotid no bruit Chest - symmetrical excursion , unlabored           Heart/CV- RRR , no murmur , no gallop  , no rub, nl s1 s2                           - JVD- none , edema- none, stasis changes- none, varices- none           Lung- Unlabored, clear, wheeze+ trace only RUA chest, cough- none , dullness-none, rub- none           Chest wall-  Abd-  Br/ Gen/ Rectal- Not done, not indicated Extrem- cyanosis- none, clubbing, none, atrophy- none, strength- nl Neuro- grossly intact to observation

## 2019-01-07 NOTE — Patient Instructions (Signed)
Order- sample x 2 Breztri  Inhale 2 puffs then rinse mouth, twice daily. Try this for 2 weeks instead of Symbicort and Spiriva. If you like Breztri let me know and we can send a script.  Please call if we can help

## 2019-01-07 NOTE — Addendum Note (Signed)
Addended by: June Leap on: 01/07/2019 12:17 PM   Modules accepted: Orders

## 2019-02-02 ENCOUNTER — Other Ambulatory Visit: Payer: Self-pay

## 2019-02-03 MED ORDER — BREZTRI AEROSPHERE 160-9-4.8 MCG/ACT IN AERO: 2.0000 | INHALATION_SPRAY | Freq: Two times a day (BID) | RESPIRATORY_TRACT | 3 refills | Status: DC

## 2019-02-09 ENCOUNTER — Ambulatory Visit: Payer: BLUE CROSS/BLUE SHIELD | Admitting: Internal Medicine

## 2019-02-12 ENCOUNTER — Other Ambulatory Visit: Payer: Self-pay

## 2019-02-12 ENCOUNTER — Ambulatory Visit: Payer: BC Managed Care – PPO | Admitting: Internal Medicine

## 2019-02-12 ENCOUNTER — Encounter: Payer: Self-pay | Admitting: Internal Medicine

## 2019-02-12 VITALS — BP 118/74 | HR 62 | Temp 98.1°F | Ht 62.5 in | Wt 124.4 lb

## 2019-02-12 DIAGNOSIS — M8580 Other specified disorders of bone density and structure, unspecified site: Secondary | ICD-10-CM | POA: Diagnosis not present

## 2019-02-12 DIAGNOSIS — J4540 Moderate persistent asthma, uncomplicated: Secondary | ICD-10-CM | POA: Diagnosis not present

## 2019-02-12 DIAGNOSIS — Z0000 Encounter for general adult medical examination without abnormal findings: Secondary | ICD-10-CM

## 2019-02-12 LAB — CBC
HCT: 39.1 % (ref 36.0–46.0)
Hemoglobin: 12.6 g/dL (ref 12.0–15.0)
MCHC: 32.3 g/dL (ref 30.0–36.0)
MCV: 79.7 fl (ref 78.0–100.0)
Platelets: 271 10*3/uL (ref 150.0–400.0)
RBC: 4.9 Mil/uL (ref 3.87–5.11)
RDW: 14.3 % (ref 11.5–15.5)
WBC: 7.5 10*3/uL (ref 4.0–10.5)

## 2019-02-12 LAB — LIPID PANEL
Cholesterol: 169 mg/dL (ref 0–200)
HDL: 88.2 mg/dL (ref >39.00)
LDL Cholesterol: 64 mg/dL (ref 0–99)
NonHDL: 80.47
Total CHOL/HDL Ratio: 2
Triglycerides: 83 mg/dL (ref 0.0–149.0)
VLDL: 16.6 mg/dL (ref 0.0–40.0)

## 2019-02-12 LAB — COMPREHENSIVE METABOLIC PANEL
ALT: 17 U/L (ref 0–35)
AST: 14 U/L (ref 0–37)
Albumin: 4.4 g/dL (ref 3.5–5.2)
Alkaline Phosphatase: 72 U/L (ref 39–117)
BUN: 22 mg/dL (ref 6–23)
CO2: 28 mEq/L (ref 19–32)
Calcium: 9.5 mg/dL (ref 8.4–10.5)
Chloride: 104 mEq/L (ref 96–112)
Creatinine, Ser: 0.79 mg/dL (ref 0.40–1.20)
GFR: 73.45 mL/min (ref >60.00)
Glucose, Bld: 93 mg/dL (ref 70–99)
Potassium: 4.2 mEq/L (ref 3.5–5.1)
Sodium: 139 mEq/L (ref 135–145)
Total Bilirubin: 0.3 mg/dL (ref 0.2–1.2)
Total Protein: 6.7 g/dL (ref 6.0–8.3)

## 2019-02-12 LAB — VITAMIN D 25 HYDROXY (VIT D DEFICIENCY, FRACTURES): VITD: 23.38 ng/mL — ABNORMAL LOW (ref 30.00–100.00)

## 2019-02-12 NOTE — Assessment & Plan Note (Signed)
Flu shot up to date. Shingrix complete. Tetanus up to date. Cologuard due 2022. Mammogram up to date, pap smear up to date and dexa up to date. Counseled about sun safety and mole surveillance. Counseled about the dangers of distracted driving. Given 10 year screening recommendations.

## 2019-02-12 NOTE — Assessment & Plan Note (Signed)
Continues boniva and monitoring with ob/gyn every 2 years.

## 2019-02-12 NOTE — Assessment & Plan Note (Signed)
Trying new inhaler from pulmonary and is not sure about this yet. No flare today.

## 2019-02-12 NOTE — Patient Instructions (Signed)
Health Maintenance, Female Adopting a healthy lifestyle and getting preventive care are important in promoting health and wellness. Ask your health care provider about:  The right schedule for you to have regular tests and exams.  Things you can do on your own to prevent diseases and keep yourself healthy. What should I know about diet, weight, and exercise? Eat a healthy diet   Eat a diet that includes plenty of vegetables, fruits, low-fat dairy products, and lean protein.  Do not eat a lot of foods that are high in solid fats, added sugars, or sodium. Maintain a healthy weight Body mass index (BMI) is used to identify weight problems. It estimates body fat based on height and weight. Your health care provider can help determine your BMI and help you achieve or maintain a healthy weight. Get regular exercise Get regular exercise. This is one of the most important things you can do for your health. Most adults should:  Exercise for at least 150 minutes each week. The exercise should increase your heart rate and make you sweat (moderate-intensity exercise).  Do strengthening exercises at least twice a week. This is in addition to the moderate-intensity exercise.  Spend less time sitting. Even light physical activity can be beneficial. Watch cholesterol and blood lipids Have your blood tested for lipids and cholesterol at 64 years of age, then have this test every 5 years. Have your cholesterol levels checked more often if:  Your lipid or cholesterol levels are high.  You are older than 64 years of age.  You are at high risk for heart disease. What should I know about cancer screening? Depending on your health history and family history, you may need to have cancer screening at various ages. This may include screening for:  Breast cancer.  Cervical cancer.  Colorectal cancer.  Skin cancer.  Lung cancer. What should I know about heart disease, diabetes, and high blood  pressure? Blood pressure and heart disease  High blood pressure causes heart disease and increases the risk of stroke. This is more likely to develop in people who have high blood pressure readings, are of African descent, or are overweight.  Have your blood pressure checked: ? Every 3-5 years if you are 18-39 years of age. ? Every year if you are 40 years old or older. Diabetes Have regular diabetes screenings. This checks your fasting blood sugar level. Have the screening done:  Once every three years after age 40 if you are at a normal weight and have a low risk for diabetes.  More often and at a younger age if you are overweight or have a high risk for diabetes. What should I know about preventing infection? Hepatitis B If you have a higher risk for hepatitis B, you should be screened for this virus. Talk with your health care provider to find out if you are at risk for hepatitis B infection. Hepatitis C Testing is recommended for:  Everyone born from 1945 through 1965.  Anyone with known risk factors for hepatitis C. Sexually transmitted infections (STIs)  Get screened for STIs, including gonorrhea and chlamydia, if: ? You are sexually active and are younger than 64 years of age. ? You are older than 64 years of age and your health care provider tells you that you are at risk for this type of infection. ? Your sexual activity has changed since you were last screened, and you are at increased risk for chlamydia or gonorrhea. Ask your health care provider if   you are at risk.  Ask your health care provider about whether you are at high risk for HIV. Your health care provider may recommend a prescription medicine to help prevent HIV infection. If you choose to take medicine to prevent HIV, you should first get tested for HIV. You should then be tested every 3 months for as long as you are taking the medicine. Pregnancy  If you are about to stop having your period (premenopausal) and  you may become pregnant, seek counseling before you get pregnant.  Take 400 to 800 micrograms (mcg) of folic acid every day if you become pregnant.  Ask for birth control (contraception) if you want to prevent pregnancy. Osteoporosis and menopause Osteoporosis is a disease in which the bones lose minerals and strength with aging. This can result in bone fractures. If you are 65 years old or older, or if you are at risk for osteoporosis and fractures, ask your health care provider if you should:  Be screened for bone loss.  Take a calcium or vitamin D supplement to lower your risk of fractures.  Be given hormone replacement therapy (HRT) to treat symptoms of menopause. Follow these instructions at home: Lifestyle  Do not use any products that contain nicotine or tobacco, such as cigarettes, e-cigarettes, and chewing tobacco. If you need help quitting, ask your health care provider.  Do not use street drugs.  Do not share needles.  Ask your health care provider for help if you need support or information about quitting drugs. Alcohol use  Do not drink alcohol if: ? Your health care provider tells you not to drink. ? You are pregnant, may be pregnant, or are planning to become pregnant.  If you drink alcohol: ? Limit how much you use to 0-1 drink a day. ? Limit intake if you are breastfeeding.  Be aware of how much alcohol is in your drink. In the [place] one drink equals one 12 oz bottle of beer (355 mL), one 5 oz glass of wine (148 mL), or one 1 oz glass of hard liquor (44 mL). General instructions  Schedule regular health, dental, and eye exams.  Stay current with your vaccines.  Tell your health care provider if: ? You often feel depressed. ? You have ever been abused or do not feel safe at home. Summary  Adopting a healthy lifestyle and getting preventive care are important in promoting health and wellness.  Follow your health care provider's instructions about healthy  diet, exercising, and getting tested or screened for diseases.  Follow your health care provider's instructions on monitoring your cholesterol and blood pressure. This information is not intended to replace advice given to you by your health care provider. Make sure you discuss any questions you have with your health care provider. Document Revised: 12/18/2017 Document Reviewed: 12/18/2017 Elsevier Patient Education  2020 Elsevier Inc.  

## 2019-02-12 NOTE — Progress Notes (Signed)
   Subjective:   Patient ID: Jessica Richards, female    DOB: [birth year], 64 y.o.   MRN: [id number]  HPI The patient is a 64 YO female coming in for physical.   PMH, FMH, social history reviewed and updated  Review of Systems  Constitutional: Negative.   HENT: Negative.   Eyes: Negative.   Respiratory: Negative for cough, chest tightness and shortness of breath.   Cardiovascular: Negative for chest pain, palpitations and leg swelling.  Gastrointestinal: Negative for abdominal distention, abdominal pain, constipation, diarrhea, nausea and vomiting.  Musculoskeletal: Positive for arthralgias.  Skin: Negative.   Neurological: Negative.   Psychiatric/Behavioral: Negative.     Objective:  Physical Exam Constitutional:      Appearance: She is well-developed.  HENT:     Head: Normocephalic and atraumatic.  Cardiovascular:     Rate and Rhythm: Normal rate and regular rhythm.  Pulmonary:     Effort: Pulmonary effort is normal. No respiratory distress.     Breath sounds: Normal breath sounds. No wheezing or rales.  Abdominal:     General: Bowel sounds are normal. There is no distension.     Palpations: Abdomen is soft.     Tenderness: There is no abdominal tenderness. There is no rebound.  Musculoskeletal:     Cervical back: Normal range of motion.  Skin:    General: Skin is warm and dry.  Neurological:     Mental Status: She is alert and oriented to person, place, and time.     Coordination: Coordination normal.     Vitals:   02/12/19 1353  BP: 118/74  Pulse: 62  Temp: 98.1 F (36.7 C)  TempSrc: Oral  SpO2: 99%  Weight: 124 lb 6 oz (56.4 kg)  Height: 5' 2.5" (1.588 m)    This visit occurred during the SARS-CoV-2 public health emergency.  Safety protocols were in place, including screening questions prior to the visit, additional usage of staff PPE, and extensive cleaning of exam room while observing appropriate contact time as indicated for disinfecting solutions.    Assessment & Plan:

## 2019-03-11 ENCOUNTER — Telehealth: Payer: Self-pay

## 2019-03-11 NOTE — Telephone Encounter (Signed)
New message    The patient seen Dr. Okey Dupre on 2.4.21 during that visit Dr. Okey Dupre agree to accept her husband as a new patient   The patient wife's husband sees MD at [facility].   Please advise

## 2019-03-12 NOTE — Telephone Encounter (Signed)
New message  Left a message on home voice mail per Dr. Frutoso Chase recommendation to see her husband as a new patient.

## 2019-03-12 NOTE — Telephone Encounter (Signed)
That's fine, no more than 1 new/transfer per day.

## 2019-04-09 ENCOUNTER — Other Ambulatory Visit: Payer: Self-pay | Admitting: Internal Medicine

## 2019-05-10 ENCOUNTER — Other Ambulatory Visit: Payer: Self-pay | Admitting: Internal Medicine

## 2019-05-13 ENCOUNTER — Other Ambulatory Visit: Payer: Self-pay | Admitting: Internal Medicine

## 2019-06-07 ENCOUNTER — Other Ambulatory Visit: Payer: Self-pay | Admitting: Internal Medicine

## 2019-06-10 ENCOUNTER — Telehealth: Payer: Self-pay | Admitting: Internal Medicine

## 2019-06-10 MED ORDER — BUDESONIDE-FORMOTEROL FUMARATE 160-4.5 MCG/ACT IN AERO: INHALATION_SPRAY | RESPIRATORY_TRACT | 3 refills | Status: DC

## 2019-06-10 NOTE — Telephone Encounter (Signed)
Received request for refill of Symbicort from patient's pharmacy.  Breztri samples were given at last visit and it appears that Markus Daft was sent to her pharmacy.  Placed call to pt to verify if she is using Symbicort or Breztri.  Will await a response from pt.

## 2019-06-10 NOTE — Telephone Encounter (Signed)
Spoke with the pt  Last ov given Breztri to try but she prefers the symbicort over this  She states needing refill sent to [facility]- rx sent

## 2019-10-23 ENCOUNTER — Other Ambulatory Visit: Payer: Self-pay

## 2019-10-23 ENCOUNTER — Ambulatory Visit (INDEPENDENT_AMBULATORY_CARE_PROVIDER_SITE_OTHER): Payer: 59 | Admitting: Family

## 2019-10-23 VITALS — BP 120/72 | HR 47 | Temp 98.2°F | Ht 62.5 in | Wt 126.0 lb

## 2019-10-23 DIAGNOSIS — F32A Depression, unspecified: Secondary | ICD-10-CM

## 2019-10-23 MED ORDER — BUPROPION HCL ER (XL) 150 MG PO TB24: 150.0000 mg | ORAL_TABLET | Freq: Every day | ORAL | 0 refills | Status: DC

## 2019-10-23 MED ORDER — BUPROPION HCL ER (XL) 150 MG PO TB24: 150.0000 mg | ORAL_TABLET | Freq: Every day | ORAL | 1 refills | Status: DC

## 2019-10-23 NOTE — Progress Notes (Signed)
Jessica Richards is a 64 y.o. female with the following history as recorded in EpicCare:  Patient Active Problem List   Diagnosis Date Noted  . Grief 08/27/2016  . Routine general medical examination at a health care facility 01/21/2015  . Osteopenia   . ALLERGIC RHINITIS 12/11/2006  . Chronic asthma, moderate persistent, uncomplicated 12/11/2006    Current Outpatient Medications  Medication Sig Dispense Refill  . albuterol (PROVENTIL) (2.5 MG/3ML) 0.083% nebulizer solution INHALE THE CONTENTS OF 1   VIAL VIA NEBULIZER 4 TIMES A DAY AS NEEDED 225 mL 3  . albuterol (VENTOLIN HFA) 108 (90 Base) MCG/ACT inhaler USE 2 INHALATIONS ORALLY   EVERY 6 HOURS AS NEEDED FORWHEEZING 25.5 g 3  . budesonide-formoterol (SYMBICORT) 160-4.5 MCG/ACT inhaler USE 2 INHALATIONS TWICE DAILY 30.6 g 3  . ibandronate (BONIVA) 150 MG tablet Take 1 tablet once a month    . meloxicam (MOBIC) 15 MG tablet Take 15 mg by mouth daily.    Marland Kitchen SPIRIVA HANDIHALER 18 MCG inhalation capsule INHALE THE CONTENTS OF ONE CAPSULE DAILY. 90 capsule 1  . theophylline (UNIPHYL) 400 MG 24 hr tablet TAKE 1 TABLET DAILY 90 tablet 3  . BREZTRI AEROSPHERE 160-9-4.8 MCG/ACT AERO USE 2 INHALATIONS ORALLY   TWICE DAILY (Patient not taking: Reported on 10/23/2019) 10.7 g 3  . buPROPion (WELLBUTRIN XL) 150 MG 24 hr tablet Take 1 tablet (150 mg total) by mouth daily. 90 tablet 0  . buPROPion (WELLBUTRIN XL) 150 MG 24 hr tablet Take 1 tablet (150 mg total) by mouth daily. 30 tablet 1   No current facility-administered medications for this visit.    Allergies: Patient has no known allergies.  Past Medical History:  Diagnosis Date  . Allergic rhinitis   . Anxiety   . Arthritis   . Asthma   . COPD bronchitis   . Dyspnea    with exertion  . Headache    due to neck psin  . Heart murmur    slight  no cardiology  . Mild mitral regurgitation by prior echocardiogram 12/17/06  . Osteopenia    DEXA @ gyn 11/909/10: -2.0 DEXA @ gyn 11/20/10: -1.8  DEXA @ gyn 11/20/12: -2.2  . Shingles 2012    Past Surgical History:  Procedure Laterality Date  . ANTERIOR CERVICAL DECOMP/DISCECTOMY FUSION N/A 02/23/2016   Procedure: ANTERIOR CERVICAL DECOMPRESSION/DISCECTOMY C5-6;  Surgeon: Venita Lick, MD;  Location: [facility] OR;  Service: Orthopedics;  Laterality: N/A;  Requests 2.5 hrs  . EYE SURGERY Bilateral    cataract surgery   2014    Family History  Problem Relation Age of Onset  . Hypertension Mother        smoker  . Coronary artery disease Father        started in 39s  . Coronary artery disease Other        female 1 st degree relative < 50-father    Social History   Tobacco Use  . Smoking status: Former Smoker    Packs/day: 3.00    Years: 15.00    Pack years: 45.00    Quit date: 01/08/1985    Years since quitting: 34.8  . Smokeless tobacco: Never Used  Substance Use Topics  . Alcohol use: No    Alcohol/week: 0.0 standard drinks    Subjective:  3 year history of depression- notes that it was triggered by loss of cat; has also struggled with changes in her life due to restrictions with COVID/ has had to find a  new church;   Has recently decided to write a book about her cat's life and feels like it has triggered re-flare of her symptoms;  Would like to try a medication to help with the symptoms;     Objective:  Vitals:   10/23/19 1308  BP: 120/72  Pulse: (!) 47  Temp: 98.2 F (36.8 C)  TempSrc: Oral  SpO2: 98%  Weight: 126 lb (57.2 kg)  Height: 5' 2.5" (1.588 m)    General: Well developed, well nourished, in no acute distress  Head: Normocephalic and atraumatic  Lungs: Respirations unlabored;  Neurologic: Alert and oriented; speech intact; face symmetrical; moves all extremities well; CNII-XII intact without focal deficit   Assessment:  1. Depression, unspecified depression type     Plan:  Discussed that suspect loss of cat is a symbol for other losses she has experienced recently; encouraged to consider meeting with  a counselor and she is in agreement; will also try Wellbutrin XL 150 mg daily; she will plan to see her PCP in follow-up in about 1 month;   Time spent 30 minutes  Return in about 1 month (around 11/23/2019) for with Dr. Okey Dupre.  Orders Placed This Encounter  Procedures  . Ambulatory referral to Psychology    Referral Priority:   Routine    Referral Type:   Psychiatric    Referral Reason:   Specialty Services Required    Requested Specialty:   Psychology    Number of Visits Requested:   1    Requested Prescriptions   Signed Prescriptions Disp Refills  . buPROPion (WELLBUTRIN XL) 150 MG 24 hr tablet 90 tablet 0    Sig: Take 1 tablet (150 mg total) by mouth daily.  Marland Kitchen buPROPion (WELLBUTRIN XL) 150 MG 24 hr tablet 30 tablet 1    Sig: Take 1 tablet (150 mg total) by mouth daily.

## 2019-11-04 ENCOUNTER — Ambulatory Visit (INDEPENDENT_AMBULATORY_CARE_PROVIDER_SITE_OTHER): Payer: 59 | Admitting: Psychologist

## 2019-11-04 DIAGNOSIS — F330 Major depressive disorder, recurrent, mild: Secondary | ICD-10-CM | POA: Diagnosis not present

## 2019-11-04 DIAGNOSIS — Z630 Problems in relationship with spouse or partner: Secondary | ICD-10-CM

## 2019-11-16 ENCOUNTER — Other Ambulatory Visit: Payer: Self-pay | Admitting: Family

## 2019-11-18 ENCOUNTER — Ambulatory Visit (INDEPENDENT_AMBULATORY_CARE_PROVIDER_SITE_OTHER): Payer: 59 | Admitting: Psychologist

## 2019-11-18 DIAGNOSIS — F330 Major depressive disorder, recurrent, mild: Secondary | ICD-10-CM | POA: Diagnosis not present

## 2019-11-18 DIAGNOSIS — Z630 Problems in relationship with spouse or partner: Secondary | ICD-10-CM

## 2019-11-23 ENCOUNTER — Other Ambulatory Visit: Payer: Self-pay

## 2019-11-23 ENCOUNTER — Encounter: Payer: Self-pay | Admitting: Internal Medicine

## 2019-11-23 ENCOUNTER — Ambulatory Visit (INDEPENDENT_AMBULATORY_CARE_PROVIDER_SITE_OTHER): Payer: 59 | Admitting: Internal Medicine

## 2019-11-23 VITALS — BP 132/84 | HR 58 | Temp 98.0°F | Ht 62.5 in | Wt 125.0 lb

## 2019-11-23 DIAGNOSIS — F4321 Adjustment disorder with depressed mood: Secondary | ICD-10-CM

## 2019-11-23 NOTE — Patient Instructions (Signed)
Let us know in a couple weeks how you are doing.

## 2019-11-23 NOTE — Progress Notes (Signed)
   Subjective:   Patient ID: Jessica Richards, female    DOB: 1955-03-28, 64 y.o.   MRN: [id number]  HPI The patient is a 64 YO female coming in for follow up new depression. She started taking wellbutrin 150 mg daily about 1 month or so ago. She is struggling with the pandemic and also personal loss. 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 She has finished this and is in rewriting process. This has brought up a lot of the grief she has been feeling. Also more disconnected to others due to covid-19 and her husband does not understand why she is still grieving some things which is frustrating to her. She feels the wellbutrin has helped some but she is not sure it is enough. May be causing her mild insomnia. However before this she was sleeping too much so it is not unwelcome.   Review of Systems  Constitutional: Negative.   HENT: Negative.   Eyes: Negative.   Respiratory: Negative for cough, chest tightness and shortness of breath.   Cardiovascular: Negative for chest pain, palpitations and leg swelling.  Gastrointestinal: Negative for abdominal distention, abdominal pain, constipation, diarrhea, nausea and vomiting.  Musculoskeletal: Negative.   Skin: Negative.   Neurological: Negative.   Psychiatric/Behavioral: Positive for decreased concentration, dysphoric mood and sleep disturbance.    Objective:  Physical Exam Constitutional:      Appearance: She is well-developed.  HENT:     Head: Normocephalic and atraumatic.  Cardiovascular:     Rate and Rhythm: Normal rate and regular rhythm.  Pulmonary:     Effort: Pulmonary effort is normal. No respiratory distress.     Breath sounds: Normal breath sounds. No wheezing or rales.  Abdominal:     General: Bowel sounds are normal. There is no distension.     Palpations: Abdomen is soft.     Tenderness: There is no abdominal tenderness. There is no rebound.  Musculoskeletal:     Cervical back: Normal range  of motion.  Skin:    General: Skin is warm and dry.  Neurological:     Mental Status: She is alert and oriented to person, place, and time.     Coordination: Coordination normal.     Vitals:   11/23/19 1340  BP: 132/84  Pulse: (!) 58  Temp: 98 F (36.7 C)  TempSrc: Oral  SpO2: 99%  Weight: 125 lb (56.7 kg)  Height: 5' 2.5" (1.588 m)    This visit occurred during the SARS-CoV-2 public health emergency.  Safety protocols were in place, including screening questions prior to the visit, additional usage of staff PPE, and extensive cleaning of exam room while observing appropriate contact time as indicated for disinfecting solutions.   Assessment & Plan:  Visit time 30 minutes in face to face communication with patient and coordination of care, additional 5 minutes spent in record review, coordination or care, ordering tests, communicating/referring to other healthcare professionals, documenting in medical records all on the same day of the visit for total time 35 minutes spent on the visit.

## 2019-11-24 NOTE — Assessment & Plan Note (Signed)
Will continue wellbutrin 150 mg daily for another month or so and evaluate success then. She is not sure if she needs to stop this or increase dosage and will let us know. Advised more social encounters with supportive individuals as well to help.

## 2019-11-25 ENCOUNTER — Ambulatory Visit (INDEPENDENT_AMBULATORY_CARE_PROVIDER_SITE_OTHER): Payer: 59 | Admitting: Psychologist

## 2019-11-25 DIAGNOSIS — Z630 Problems in relationship with spouse or partner: Secondary | ICD-10-CM | POA: Diagnosis not present

## 2019-11-25 DIAGNOSIS — F330 Major depressive disorder, recurrent, mild: Secondary | ICD-10-CM | POA: Diagnosis not present

## 2019-12-09 ENCOUNTER — Ambulatory Visit (INDEPENDENT_AMBULATORY_CARE_PROVIDER_SITE_OTHER): Payer: 59 | Admitting: Psychologist

## 2019-12-09 DIAGNOSIS — Z634 Disappearance and death of family member: Secondary | ICD-10-CM | POA: Diagnosis not present

## 2019-12-09 DIAGNOSIS — F330 Major depressive disorder, recurrent, mild: Secondary | ICD-10-CM | POA: Diagnosis not present

## 2019-12-15 ENCOUNTER — Encounter: Payer: Self-pay | Admitting: Internal Medicine

## 2019-12-17 MED ORDER — BUPROPION HCL ER (XL) 150 MG PO TB24: 150.0000 mg | ORAL_TABLET | Freq: Every day | ORAL | 1 refills | Status: DC

## 2019-12-18 ENCOUNTER — Other Ambulatory Visit: Payer: Self-pay | Admitting: Internal Medicine

## 2019-12-18 MED ORDER — SPIRIVA HANDIHALER 18 MCG IN CAPS
ORAL_CAPSULE | RESPIRATORY_TRACT | 4 refills | Status: DC
Start: 2019-12-18 — End: 2020-11-17

## 2019-12-18 MED ORDER — ALBUTEROL SULFATE HFA 108 (90 BASE) MCG/ACT IN AERS: 2.0000 | INHALATION_SPRAY | Freq: Four times a day (QID) | RESPIRATORY_TRACT | 12 refills | Status: DC | PRN

## 2019-12-18 NOTE — Telephone Encounter (Signed)
Documentation- refills sent for theophylline, Spiriva, Proair (albuterol)

## 2019-12-30 ENCOUNTER — Ambulatory Visit (INDEPENDENT_AMBULATORY_CARE_PROVIDER_SITE_OTHER): Payer: 59 | Admitting: Psychologist

## 2019-12-30 DIAGNOSIS — F330 Major depressive disorder, recurrent, mild: Secondary | ICD-10-CM

## 2019-12-30 DIAGNOSIS — Z630 Problems in relationship with spouse or partner: Secondary | ICD-10-CM | POA: Diagnosis not present

## 2020-01-06 NOTE — Progress Notes (Signed)
HPI F former smoker followed for allergic rhinitis, asthma 01/22/11-99%, 99%, 97%, 456 meters Not limited by oxygenation -CY PFT 01/22/2011-mild obstructive airways disease a slight response to bronchodilator, air trapping, normal diffusion. FEV1 2.18/95%, FEV1/FVC 0.65, FEF 25-75% 1.19/44%. RV 120%, DLCO 91% Office Spirometry 12/27/14- -------------------------------------------------------------------------- . 01/07/2019-  64 year-old female former smoker followed for allergic rhinitis, asthma/COPD -----F/U Chronic asthma. Breathing is at patient's baseline. Neb albut, albuterol hfa, Spiriva, Symbicort 160, theophylline 400,  Feels very well controlled. Used neb only once after cat exposure. Last rescue use 1 month ago. Has tried to stay active, but slowed down by L hip replacement in August- still hurts some. Had flu vax  01/07/20- 56 year-old female former smoker followed for allergic rhinitis, asthma/COPD Neb albut, albuterol hfa, Spiriva, Symbicort 160, theophylline 400,  Covid vax- 3 Moderna Flu vax- had ACT- 23 Has a cat -----Patient has been started on Wellbutrin for depression which has helped some. Breathing is better controlled. Virtual counseling.  Her outdoor cat is curently living in basement which may be triggering mild tightness. Hasn't needed nebulizer in years, but keeps available. Only occ needs rescue hfa. Feels wheeze if she omits theophylline. Breztri caused thrush- she prefers current meds.  ROS-see HPI + = positive Constitutional:   No-   weight loss, night sweats, fevers, chills, fatigue, lassitude. HEENT:   No-  headaches, difficulty swallowing, tooth/dental problems, sore throat,       No-  sneezing, itching, ear ache, nasal congestion, post nasal drip,  CV:  No-   chest pain, orthopnea, PND, swelling in lower extremities, anasarca, dizziness, palpitations Resp: +shortness of breath with exertion .            sign   productive cough,  No- non-productive  cough,  No- coughing up of blood.              change in color of mucus.  No- wheezing.   Skin: No-   rash or lesions. GI:  No-   heartburn, indigestion, abdominal pain, nausea, vomiting,  GU:  MS:  +   joint pain or swelling.  Neuro-     nothing unusual Psych:  No- change in mood or affect. No depression or anxiety.  No memory loss.  OBJ General- Alert, Oriented, Affect-appropriate, Distress- none acute, trim Skin- rash-none, lesions- none, excoriation- none Lymphadenopathy- none Head- atraumatic            Eyes- Gross vision intact, PERRLA, conjunctivae clear secretions            Ears- Hearing, canals-normal            Nose- Clear, no-Septal dev, mucus, polyps, erosion, perforation             Throat- Mallampati II , mucosa clear , drainage- none, tonsils- atrophic Neck- flexible , trachea midline, no stridor , thyroid nl, carotid no bruit Chest - symmetrical excursion , unlabored           Heart/CV- RRR , no murmur , no gallop  , no rub, nl s1 s2                           - JVD- none , edema- none, stasis changes- none, varices- none           Lung- Unlabored, clear, wheeze- none, cough- none , dullness-none, rub- none           Chest wall-  Abd-  Br/  Gen/ Rectal- Not done, not indicated Extrem- cyanosis- none, clubbing, none, atrophy- none, strength- nl Neuro- grossly intact to observation

## 2020-01-07 ENCOUNTER — Encounter: Payer: Self-pay | Admitting: Internal Medicine

## 2020-01-07 ENCOUNTER — Other Ambulatory Visit: Payer: Self-pay

## 2020-01-07 ENCOUNTER — Ambulatory Visit: Payer: BC Managed Care – PPO | Admitting: Internal Medicine

## 2020-01-07 DIAGNOSIS — J3089 Other allergic rhinitis: Secondary | ICD-10-CM | POA: Diagnosis not present

## 2020-01-07 DIAGNOSIS — J302 Other seasonal allergic rhinitis: Secondary | ICD-10-CM

## 2020-01-07 DIAGNOSIS — J4540 Moderate persistent asthma, uncomplicated: Secondary | ICD-10-CM | POA: Diagnosis not present

## 2020-01-07 NOTE — Assessment & Plan Note (Signed)
Not significant now despite Spring-like weather

## 2020-01-07 NOTE — Assessment & Plan Note (Signed)
Well controlled on current meds. Note- she likes theophylline. Breztri caused thrush.  Plan- continue current meds.

## 2020-01-07 NOTE — Patient Instructions (Signed)
We can continue current meds  Please call if we can help 

## 2020-01-27 ENCOUNTER — Ambulatory Visit: Payer: 59 | Admitting: Psychologist

## 2020-02-16 ENCOUNTER — Encounter: Payer: Self-pay | Admitting: Internal Medicine

## 2020-02-16 ENCOUNTER — Other Ambulatory Visit: Payer: Self-pay

## 2020-02-16 ENCOUNTER — Ambulatory Visit (INDEPENDENT_AMBULATORY_CARE_PROVIDER_SITE_OTHER): Payer: 59 | Admitting: Internal Medicine

## 2020-02-16 VITALS — BP 124/80 | HR 54 | Temp 98.1°F | Resp 18 | Ht 62.5 in | Wt 121.6 lb

## 2020-02-16 DIAGNOSIS — J4540 Moderate persistent asthma, uncomplicated: Secondary | ICD-10-CM

## 2020-02-16 DIAGNOSIS — F4321 Adjustment disorder with depressed mood: Secondary | ICD-10-CM | POA: Diagnosis not present

## 2020-02-16 DIAGNOSIS — Z0000 Encounter for general adult medical examination without abnormal findings: Secondary | ICD-10-CM

## 2020-02-16 LAB — LIPID PANEL
Cholesterol: 183 mg/dL (ref 0–200)
HDL: 90.1 mg/dL (ref >39.00)
LDL Cholesterol: 82 mg/dL (ref 0–99)
NonHDL: 92.62
Total CHOL/HDL Ratio: 2
Triglycerides: 51 mg/dL (ref 0.0–149.0)
VLDL: 10.2 mg/dL (ref 0.0–40.0)

## 2020-02-16 LAB — COMPREHENSIVE METABOLIC PANEL
ALT: 18 U/L (ref 0–35)
AST: 17 U/L (ref 0–37)
Albumin: 4.2 g/dL (ref 3.5–5.2)
Alkaline Phosphatase: 73 U/L (ref 39–117)
BUN: 16 mg/dL (ref 6–23)
CO2: 28 mEq/L (ref 19–32)
Calcium: 9.2 mg/dL (ref 8.4–10.5)
Chloride: 106 mEq/L (ref 96–112)
Creatinine, Ser: 0.82 mg/dL (ref 0.40–1.20)
GFR: 75.68 mL/min (ref >60.00)
Glucose, Bld: 77 mg/dL (ref 70–99)
Potassium: 4.1 mEq/L (ref 3.5–5.1)
Sodium: 139 mEq/L (ref 135–145)
Total Bilirubin: 0.4 mg/dL (ref 0.2–1.2)
Total Protein: 6.6 g/dL (ref 6.0–8.3)

## 2020-02-16 LAB — CBC
HCT: 39.7 % (ref 36.0–46.0)
Hemoglobin: 12.9 g/dL (ref 12.0–15.0)
MCHC: 32.4 g/dL (ref 30.0–36.0)
MCV: 79 fl (ref 78.0–100.0)
Platelets: 259 10*3/uL (ref 150.0–400.0)
RBC: 5.02 Mil/uL (ref 3.87–5.11)
RDW: 14 % (ref 11.5–15.5)
WBC: 4.6 10*3/uL (ref 4.0–10.5)

## 2020-02-16 LAB — VITAMIN D 25 HYDROXY (VIT D DEFICIENCY, FRACTURES): VITD: 36.8 ng/mL (ref 30.00–100.00)

## 2020-02-16 NOTE — Assessment & Plan Note (Signed)
Flu shot up to date. Covid-19 up to date. Pneumonia due at 65. Shingrix complete. Tetanus up to date. Cologuard due Aug 2022. Mammogram up to date, pap smear up to date and dexa up to date. Counseled about sun safety and mole surveillance. Counseled about the dangers of distracted driving. Given 10 year screening recommendations.

## 2020-02-16 NOTE — Assessment & Plan Note (Signed)
Doing well with wellbutrin and would like to continue for now.

## 2020-02-16 NOTE — Progress Notes (Signed)
   Subjective:   Patient ID: Jessica Richards, female    DOB: 25-Dec-1955, 65 y.o.   MRN: [id number]  HPI The patient is a 65 YO female coming in for physical.  PMH, [facility], social history reviewed and updated  August 08, 2020 cologuard  Review of Systems  Constitutional: Negative.   HENT: Negative.   Eyes: Negative.   Respiratory: Negative for cough, chest tightness and shortness of breath.   Cardiovascular: Negative for chest pain, palpitations and leg swelling.  Gastrointestinal: Negative for abdominal distention, abdominal pain, constipation, diarrhea, nausea and vomiting.  Musculoskeletal: Negative.   Skin: Negative.   Neurological: Negative.   Psychiatric/Behavioral: Negative.     Objective:  Physical Exam Constitutional:      Appearance: She is well-developed and well-nourished.  HENT:     Head: Normocephalic and atraumatic.  Eyes:     Extraocular Movements: EOM normal.  Cardiovascular:     Rate and Rhythm: Normal rate and regular rhythm.  Pulmonary:     Effort: Pulmonary effort is normal. No respiratory distress.     Breath sounds: Normal breath sounds. No wheezing or rales.  Abdominal:     General: Bowel sounds are normal. There is no distension.     Palpations: Abdomen is soft.     Tenderness: There is no abdominal tenderness. There is no rebound.  Musculoskeletal:        General: No edema.     Cervical back: Normal range of motion.  Skin:    General: Skin is warm and dry.  Neurological:     Mental Status: She is alert and oriented to person, place, and time.     Coordination: Coordination normal.  Psychiatric:        Mood and Affect: Mood and affect normal.    Vitals:   02/16/20 1257  BP: 124/80  Pulse: (!) 54  Resp: 18  Temp: 98.1 F (36.7 C)  TempSrc: Oral  SpO2: 97%  Weight: 121 lb 9.6 oz (55.2 kg)  Height: 5' 2.5" (1.588 m)   This visit occurred during the SARS-CoV-2 public health emergency.  Safety protocols were in place, including screening  questions prior to the visit, additional usage of staff PPE, and extensive cleaning of exam room while observing appropriate contact time as indicated for disinfecting solutions.   Assessment & Plan:

## 2020-02-16 NOTE — Assessment & Plan Note (Signed)
No flare today and stable on theophylline and spiriva and symbicort with albuterol prn.

## 2020-02-16 NOTE — Patient Instructions (Addendum)
Health Maintenance, Female Adopting a healthy lifestyle and getting preventive care are important in promoting health and wellness. Ask your health care provider about:  The right schedule for you to have regular tests and exams.  Things you can do on your own to prevent diseases and keep yourself healthy. What should I know about diet, weight, and exercise? Eat a healthy diet  Eat a diet that includes plenty of vegetables, fruits, low-fat dairy products, and lean protein.  Do not eat a lot of foods that are high in solid fats, added sugars, or sodium.   Maintain a healthy weight Body mass index (BMI) is used to identify weight problems. It estimates body fat based on height and weight. Your health care provider can help determine your BMI and help you achieve or maintain a healthy weight. Get regular exercise Get regular exercise. This is one of the most important things you can do for your health. Most adults should:  Exercise for at least 150 minutes each week. The exercise should increase your heart rate and make you sweat (moderate-intensity exercise).  Do strengthening exercises at least twice a week. This is in addition to the moderate-intensity exercise.  Spend less time sitting. Even light physical activity can be beneficial. Watch cholesterol and blood lipids Have your blood tested for lipids and cholesterol at 65 years of age, then have this test every 5 years. Have your cholesterol levels checked more often if:  Your lipid or cholesterol levels are high.  You are older than 65 years of age.  You are at high risk for heart disease. What should I know about cancer screening? Depending on your health history and family history, you may need to have cancer screening at various ages. This may include screening for:  Breast cancer.  Cervical cancer.  Colorectal cancer.  Skin cancer.  Lung cancer. What should I know about heart disease, diabetes, and high blood  pressure? Blood pressure and heart disease  High blood pressure causes heart disease and increases the risk of stroke. This is more likely to develop in people who have high blood pressure readings, are of African descent, or are overweight.  Have your blood pressure checked: ? Every 3-5 years if you are 18-39 years of age. ? Every year if you are 40 years old or older. Diabetes Have regular diabetes screenings. This checks your fasting blood sugar level. Have the screening done:  Once every three years after age 40 if you are at a normal weight and have a low risk for diabetes.  More often and at a younger age if you are overweight or have a high risk for diabetes. What should I know about preventing infection? Hepatitis B If you have a higher risk for hepatitis B, you should be screened for this virus. Talk with your health care provider to find out if you are at risk for hepatitis B infection. Hepatitis C Testing is recommended for:  Everyone born from 1945 through 1965.  Anyone with known risk factors for hepatitis C. Sexually transmitted infections (STIs)  Get screened for STIs, including gonorrhea and chlamydia, if: ? You are sexually active and are younger than 65 years of age. ? You are older than 65 years of age and your health care provider tells you that you are at risk for this type of infection. ? Your sexual activity has changed since you were last screened, and you are at increased risk for chlamydia or gonorrhea. Ask your health care provider   if you are at risk.  Ask your health care provider about whether you are at high risk for HIV. Your health care provider may recommend a prescription medicine to help prevent HIV infection. If you choose to take medicine to prevent HIV, you should first get tested for HIV. You should then be tested every 3 months for as long as you are taking the medicine. Pregnancy  If you are about to stop having your period (premenopausal) and  you may become pregnant, seek counseling before you get pregnant.  Take 400 to 800 micrograms (mcg) of folic acid every day if you become pregnant.  Ask for birth control (contraception) if you want to prevent pregnancy. Osteoporosis and menopause Osteoporosis is a disease in which the bones lose minerals and strength with aging. This can result in bone fractures. If you are 65 years old or older, or if you are at risk for osteoporosis and fractures, ask your health care provider if you should:  Be screened for bone loss.  Take a calcium or vitamin D supplement to lower your risk of fractures.  Be given hormone replacement therapy (HRT) to treat symptoms of menopause. Follow these instructions at home: Lifestyle  Do not use any products that contain nicotine or tobacco, such as cigarettes, e-cigarettes, and chewing tobacco. If you need help quitting, ask your health care provider.  Do not use street drugs.  Do not share needles.  Ask your health care provider for help if you need support or information about quitting drugs. Alcohol use  Do not drink alcohol if: ? Your health care provider tells you not to drink. ? You are pregnant, may be pregnant, or are planning to become pregnant.  If you drink alcohol: ? Limit how much you use to 0-1 drink a day. ? Limit intake if you are breastfeeding.  Be aware of how much alcohol is in your drink. In the [place] one drink equals one 12 oz bottle of beer (355 mL), one 5 oz glass of wine (148 mL), or one 1 oz glass of hard liquor (44 mL). General instructions  Schedule regular health, dental, and eye exams.  Stay current with your vaccines.  Tell your health care provider if: ? You often feel depressed. ? You have ever been abused or do not feel safe at home. Summary  Adopting a healthy lifestyle and getting preventive care are important in promoting health and wellness.  Follow your health care provider's instructions about healthy  diet, exercising, and getting tested or screened for diseases.  Follow your health care provider's instructions on monitoring your cholesterol and blood pressure. This information is not intended to replace advice given to you by your health care provider. Make sure you discuss any questions you have with your health care provider. Document Revised: 12/18/2017 Document Reviewed: 12/18/2017 Elsevier Patient Education  2021 Elsevier Inc.  

## 2020-03-24 NOTE — Telephone Encounter (Signed)
Fine to change to [employer]. It contains the drugs that are in Symbicort plus Spiriva, so don't take either of those when using Breztri. Let us know when you need a prescription for Breztri.

## 2020-06-01 MED ORDER — ALBUTEROL SULFATE (2.5 MG/3ML) 0.083% IN NEBU: INHALATION_SOLUTION | RESPIRATORY_TRACT | 3 refills | Status: DC

## 2020-06-11 ENCOUNTER — Other Ambulatory Visit: Payer: Self-pay | Admitting: Internal Medicine

## 2020-08-15 ENCOUNTER — Other Ambulatory Visit: Payer: Self-pay | Admitting: Internal Medicine

## 2020-08-15 DIAGNOSIS — Z1211 Encounter for screening for malignant neoplasm of colon: Secondary | ICD-10-CM

## 2020-08-30 LAB — COLOGUARD: Cologuard: NEGATIVE

## 2020-11-16 NOTE — Telephone Encounter (Signed)
CY please advise. Thanks   I have a new drug plan since I turned 65 this month; it's with [employer] [facility] [employer]) and the drugs are less expensive if I use their mail order ([facility]).  Heads up that they they are going to fax you an order request for the [employer].  I was on it, but switched back to the Spiriva when my drug plan changed last year and the Jeanie Sewer was too expensive.  But, I can get it for $125 for 3 month supply with the new RX thru [employer], so I'd like to switch back please.  I liked the Bretzri better than the Sprivia.  I'll see you on Dec 8 for my yearly checkup.   Thanks,  Jessica Richards December

## 2020-11-16 NOTE — Telephone Encounter (Signed)
Please dc Spiriva, and send Rx for Breztri to [employer] order   # 180 = 3 months, ref x 4   Inhale 2 puffs then rinse mouth, twice daily

## 2020-11-17 MED ORDER — BREZTRI AEROSPHERE 160-9-4.8 MCG/ACT IN AERO: 2.0000 | INHALATION_SPRAY | Freq: Two times a day (BID) | RESPIRATORY_TRACT | 3 refills | Status: DC

## 2020-12-14 NOTE — Progress Notes (Signed)
HPI F former smoker followed for allergic rhinitis, asthma 01/22/11-99%, 99%, 97%, 456 meters Not limited by oxygenation -CY PFT 01/22/2011-mild obstructive airways disease a slight response to bronchodilator, air trapping, normal diffusion. FEV1 2.18/95%, FEV1/FVC 0.65, FEF 25-75% 1.19/44%. RV 120%, DLCO 91% Office Spirometry 12/27/14- --------------------------------------------------------------------------   01/07/20- 65 year-old female former smoker followed for allergic rhinitis, asthma/COPD Neb albut, albuterol hfa, Spiriva, Symbicort 160, theophylline 400,  Covid vax- 3 Moderna Flu vax- had ACT- 23 Has a cat -----Patient has been started on Wellbutrin for depression which has helped some. Breathing is better controlled. Virtual counseling.  Her outdoor cat is curently living in basement which may be triggering mild tightness. Hasn't needed nebulizer in years, but keeps available. Only occ needs rescue hfa. Feels wheeze if she omits theophylline. Breztri caused thrush- she prefers current meds.  12/15/20- 14 year-old female former smoker followed for allergic rhinitis, asthma/COPD Neb albut, albuterol hfa, Breztri, theophylline 400,  Covid vax- 4 Moderna Flu vax- had ACT- 24 -----Patient feels some runny nose and some nasal congestion. Asthma well-controlled on Breo as tree and theophylline.  She does not want to stop theophylline.  Infrequent need for albuterol rescue inhaler. Discussed rhinorrhea.  She has started Zyrtec.  I suggested adding Flonase. 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  ROS-see HPI + = positive Constitutional:   No-   weight loss, night sweats, fevers, chills, fatigue, lassitude. HEENT:   No-  headaches, difficulty swallowing, tooth/dental problems, sore throat,       No-  sneezing, itching, ear ache, nasal congestion, post nasal drip,  CV:  No-   chest pain, orthopnea, PND, swelling in lower extremities, anasarca, dizziness,  palpitations Resp: +shortness of breath with exertion .            sign   productive cough,  No- non-productive cough,  No- coughing up of blood.              change in color of mucus.  No- wheezing.   Skin: No-   rash or lesions. GI:  No-   heartburn, indigestion, abdominal pain, nausea, vomiting,  GU:  MS:  +   joint pain or swelling.  Neuro-     nothing unusual Psych:  No- change in mood or affect. No depression or anxiety.  No memory loss.  OBJ General- Alert, Oriented, Affect-appropriate, Distress- none acute, trim Skin- rash-none, lesions- none, excoriation- none Lymphadenopathy- none Head- atraumatic            Eyes- Gross vision intact, PERRLA, conjunctivae clear secretions            Ears- Hearing, canals-normal            Nose- Clear, no-Septal dev, mucus, polyps, erosion, perforation             Throat- Mallampati II , mucosa clear , drainage- none, tonsils- atrophic Neck- flexible , trachea midline, no stridor , thyroid nl, carotid no bruit Chest - symmetrical excursion , unlabored           Heart/CV- RRR , no murmur , no gallop  , no rub, nl s1 s2                           - JVD- none , edema- none, stasis changes- none, varices- none           Lung- Unlabored, clear, wheeze- none, cough- none , dullness-none,  rub- none           Chest wall-  Abd-  Br/ Gen/ Rectal- Not done, not indicated Extrem- cyanosis- none, clubbing, none, atrophy- none, strength- nl Neuro- grossly intact to observation

## 2020-12-15 ENCOUNTER — Other Ambulatory Visit: Payer: Self-pay

## 2020-12-15 ENCOUNTER — Ambulatory Visit: Payer: Medicare HMO | Admitting: Internal Medicine

## 2020-12-15 ENCOUNTER — Encounter: Payer: Self-pay | Admitting: Internal Medicine

## 2020-12-15 DIAGNOSIS — J4540 Moderate persistent asthma, uncomplicated: Secondary | ICD-10-CM

## 2020-12-15 DIAGNOSIS — J302 Other seasonal allergic rhinitis: Secondary | ICD-10-CM | POA: Diagnosis not present

## 2020-12-15 DIAGNOSIS — J3089 Other allergic rhinitis: Secondary | ICD-10-CM

## 2020-12-15 NOTE — Assessment & Plan Note (Signed)
Nonspecific increased rhinorrhea probably responding to seasonal changes. Plan-Zyrtec plus Flonase as discussed

## 2020-12-15 NOTE — Assessment & Plan Note (Signed)
Very well controlled with Breztri and theophylline.  She does not want to stop theophylline and tolerates it well. Plan-continue current meds

## 2020-12-15 NOTE — Patient Instructions (Addendum)
Ok to use Zyrtec  Consider adding Flonase nasal spray (otc- generic is fluticasone)   1-2 puffs each nostril once daily at bedtime  We can continue the asthma meds  Please call if we can help

## 2020-12-29 DIAGNOSIS — N958 Other specified menopausal and perimenopausal disorders: Secondary | ICD-10-CM | POA: Diagnosis not present

## 2020-12-29 DIAGNOSIS — Z7983 Long term (current) use of bisphosphonates: Secondary | ICD-10-CM | POA: Diagnosis not present

## 2020-12-29 DIAGNOSIS — M816 Localized osteoporosis [Lequesne]: Secondary | ICD-10-CM | POA: Diagnosis not present

## 2021-01-04 ENCOUNTER — Other Ambulatory Visit: Payer: Self-pay | Admitting: Internal Medicine

## 2021-01-05 NOTE — Telephone Encounter (Signed)
Albuterol hfa refilled at [facility]

## 2021-01-06 ENCOUNTER — Ambulatory Visit: Payer: 59 | Admitting: Internal Medicine

## 2021-01-16 DIAGNOSIS — M25561 Pain in right knee: Secondary | ICD-10-CM | POA: Diagnosis not present

## 2021-01-26 ENCOUNTER — Other Ambulatory Visit: Payer: Self-pay | Admitting: *Deleted

## 2021-01-26 MED ORDER — THEOPHYLLINE ER 400 MG PO TB24: 400.0000 mg | ORAL_TABLET | Freq: Every day | ORAL | 3 refills | Status: DC

## 2021-02-13 DIAGNOSIS — M25561 Pain in right knee: Secondary | ICD-10-CM | POA: Diagnosis not present

## 2021-03-10 DIAGNOSIS — Z8249 Family history of ischemic heart disease and other diseases of the circulatory system: Secondary | ICD-10-CM | POA: Diagnosis not present

## 2021-03-10 DIAGNOSIS — R030 Elevated blood-pressure reading, without diagnosis of hypertension: Secondary | ICD-10-CM | POA: Diagnosis not present

## 2021-03-10 DIAGNOSIS — Z008 Encounter for other general examination: Secondary | ICD-10-CM | POA: Diagnosis not present

## 2021-03-10 DIAGNOSIS — M810 Age-related osteoporosis without current pathological fracture: Secondary | ICD-10-CM | POA: Diagnosis not present

## 2021-03-10 DIAGNOSIS — Z96649 Presence of unspecified artificial hip joint: Secondary | ICD-10-CM | POA: Diagnosis not present

## 2021-03-10 DIAGNOSIS — M1990 Unspecified osteoarthritis, unspecified site: Secondary | ICD-10-CM | POA: Diagnosis not present

## 2021-03-10 DIAGNOSIS — I951 Orthostatic hypotension: Secondary | ICD-10-CM | POA: Diagnosis not present

## 2021-03-10 DIAGNOSIS — Z87891 Personal history of nicotine dependence: Secondary | ICD-10-CM | POA: Diagnosis not present

## 2021-03-10 DIAGNOSIS — Z7982 Long term (current) use of aspirin: Secondary | ICD-10-CM | POA: Diagnosis not present

## 2021-03-10 DIAGNOSIS — J45909 Unspecified asthma, uncomplicated: Secondary | ICD-10-CM | POA: Diagnosis not present

## 2021-03-20 ENCOUNTER — Encounter: Payer: Medicare HMO | Admitting: Internal Medicine

## 2021-04-05 ENCOUNTER — Ambulatory Visit (INDEPENDENT_AMBULATORY_CARE_PROVIDER_SITE_OTHER): Payer: Medicare HMO | Admitting: Internal Medicine

## 2021-04-05 ENCOUNTER — Encounter: Payer: Self-pay | Admitting: Internal Medicine

## 2021-04-05 VITALS — BP 112/70 | HR 52 | Resp 18 | Ht 62.5 in | Wt 122.2 lb

## 2021-04-05 DIAGNOSIS — Z23 Encounter for immunization: Secondary | ICD-10-CM

## 2021-04-05 DIAGNOSIS — Z0000 Encounter for general adult medical examination without abnormal findings: Secondary | ICD-10-CM

## 2021-04-05 DIAGNOSIS — J4540 Moderate persistent asthma, uncomplicated: Secondary | ICD-10-CM | POA: Diagnosis not present

## 2021-04-05 DIAGNOSIS — M8580 Other specified disorders of bone density and structure, unspecified site: Secondary | ICD-10-CM | POA: Diagnosis not present

## 2021-04-05 LAB — COMPREHENSIVE METABOLIC PANEL
ALT: 18 U/L (ref 0–35)
AST: 20 U/L (ref 0–37)
Albumin: 4.4 g/dL (ref 3.5–5.2)
Alkaline Phosphatase: 66 U/L (ref 39–117)
BUN: 18 mg/dL (ref 6–23)
CO2: 29 mEq/L (ref 19–32)
Calcium: 10 mg/dL (ref 8.4–10.5)
Chloride: 105 mEq/L (ref 96–112)
Creatinine, Ser: 0.83 mg/dL (ref 0.40–1.20)
GFR: 73.99 mL/min (ref >60.00)
Glucose, Bld: 98 mg/dL (ref 70–99)
Potassium: 4.5 mEq/L (ref 3.5–5.1)
Sodium: 141 mEq/L (ref 135–145)
Total Bilirubin: 0.4 mg/dL (ref 0.2–1.2)
Total Protein: 6.6 g/dL (ref 6.0–8.3)

## 2021-04-05 LAB — CBC
HCT: 40 % (ref 36.0–46.0)
Hemoglobin: 12.8 g/dL (ref 12.0–15.0)
MCHC: 32.2 g/dL (ref 30.0–36.0)
MCV: 79.4 fl (ref 78.0–100.0)
Platelets: 243 10*3/uL (ref 150.0–400.0)
RBC: 5.03 Mil/uL (ref 3.87–5.11)
RDW: 13.5 % (ref 11.5–15.5)
WBC: 4.7 10*3/uL (ref 4.0–10.5)

## 2021-04-05 LAB — LIPID PANEL
Cholesterol: 184 mg/dL (ref 0–200)
HDL: 98.2 mg/dL (ref >39.00)
LDL Cholesterol: 76 mg/dL (ref 0–99)
NonHDL: 85.5
Total CHOL/HDL Ratio: 2
Triglycerides: 48 mg/dL (ref 0.0–149.0)
VLDL: 9.6 mg/dL (ref 0.0–40.0)

## 2021-04-05 LAB — VITAMIN D 25 HYDROXY (VIT D DEFICIENCY, FRACTURES): VITD: 39.17 ng/mL (ref 30.00–100.00)

## 2021-04-05 NOTE — Assessment & Plan Note (Signed)
Flu shot up to date. Covid-19 up to date. Pneumonia given prevnar 20. Shingrix complete. Tetanus due 2030. Colonoscopy due 2029. Mammogram due 2024, pap smear aged out prior to recall and dexa due 2024. Counseled about sun safety and mole surveillance. Counseled about the dangers of distracted driving. Given 10 year screening recommendations.  ? ?

## 2021-04-05 NOTE — Progress Notes (Signed)
? ?Subjective:  ? ?Patient ID: Jessica Richards, female    DOB: 1955-04-26, 66 y.o.   MRN: [id number] ? ?HPI ?Here for welcome to medicare wellness and physical, no new complaints. Please see A/P for status and treatment of chronic medical problems.  ? ?Diet: heart healthy ?Physical activity: biking, exercises daily ?Depression/mood screen: negative ?Hearing: intact to whispered voice ?Visual acuity: grossly normal with lens, performs annual eye exam  ?ADLs: capable ?Fall risk: none ?Home safety: good ?Cognitive evaluation: intact to orientation, naming, recall and repetition ?EOL planning: adv directives discussed, not in place ? ?Flowsheet Row Office Visit from 04/05/2021 in [facility] [place]  ?PHQ-2 Total Score 0  ? ?  ?  ?Flowsheet Row Office Visit from 02/16/2020 in [facility] [place]  ?PHQ-9 Total Score 13  ? ?  ? ? ?  01/16/2013  ? 10:32 PM 02/06/2018  ?  1:03 PM 11/23/2019  ?  1:45 PM 04/05/2021  ?  9:10 AM  ?Fall Risk  ?Falls in the past year? No 0 0 0  ?Was there an injury with Fall?   0 0  ?Fall Risk Category Calculator   0 0  ?Fall Risk Category   Low Low  ?Patient Fall Risk Level   Low fall risk   ? ?Vision Screening  ? Right eye Left eye Both eyes  ?Without correction 20/30 20/25   ?With correction     ? ? ?Functional Status Survey: ?Is the patient deaf or have difficulty hearing?: No ?Does the patient have difficulty seeing, even when wearing glasses/contacts?: No ?Does the patient have difficulty concentrating, remembering, or making decisions?: No ?Does the patient have difficulty walking or climbing stairs?: No ?Does the patient have difficulty dressing or bathing?: No ?Does the patient have difficulty doing errands alone such as visiting a doctor's office or shopping?: No ? ?I have personally reviewed and have noted ?1. The patient's medical and social history - reviewed today no changes ?2. Their use of alcohol, tobacco or illicit drugs ?3. Their current medications and  supplements ?4. The patient's functional ability including ADL's, fall risks, home safety risks and hearing or visual impairment. ?5. Diet and physical activities ?6. Evidence for depression or mood disorders ?7. Care team reviewed and updated ?8.  The patient is not on an opioid pain medication. ? ?Patient Care Team: ?Myrlene Broker, MD as PCP - General (Internal Medicine) ?Past Medical History:  ?Diagnosis Date  ? Allergic rhinitis   ? Anxiety   ? Arthritis   ? Asthma   ? COPD bronchitis   ? Dyspnea   ? with exertion  ? Headache   ? due to neck psin  ? Heart murmur   ? slight  no cardiology  ? Mild mitral regurgitation by prior echocardiogram 12/17/06  ? Osteopenia   ? DEXA @ gyn 11/909/10: -2.0 DEXA @ gyn 11/20/10: -1.8 DEXA @ gyn 11/20/12: -2.2  ? Shingles 2012  ? ?Past Surgical History:  ?Procedure Laterality Date  ? ANTERIOR CERVICAL DECOMP/DISCECTOMY FUSION N/A 02/23/2016  ? Procedure: ANTERIOR CERVICAL DECOMPRESSION/DISCECTOMY C5-6;  Surgeon: Venita Lick, MD;  Location: [facility] OR;  Service: Orthopedics;  Laterality: N/A;  Requests 2.5 hrs  ? EYE SURGERY Bilateral   ? cataract surgery   2014  ? ?Family History  ?Problem Relation Age of Onset  ? Hypertension Mother   ?     smoker  ? Coronary artery disease Father   ?     started in  40s  ? Coronary artery disease Other   ?     female 1 st degree relative < 50-father  ? ?Review of Systems  ?Constitutional: Negative.   ?HENT: Negative.    ?Eyes: Negative.   ?Respiratory:  Negative for cough, chest tightness and shortness of breath.   ?Cardiovascular:  Negative for chest pain, palpitations and leg swelling.  ?Gastrointestinal:  Negative for abdominal distention, abdominal pain, constipation, diarrhea, nausea and vomiting.  ?Musculoskeletal: Negative.   ?Skin: Negative.   ?Neurological: Negative.   ?Psychiatric/Behavioral: Negative.    ? ?Objective:  ?Physical Exam ?Constitutional:   ?   Appearance: She is well-developed.  ?HENT:  ?   Head: Normocephalic and  atraumatic.  ?Cardiovascular:  ?   Rate and Rhythm: Normal rate and regular rhythm.  ?Pulmonary:  ?   Effort: Pulmonary effort is normal. No respiratory distress.  ?   Breath sounds: Normal breath sounds. No wheezing or rales.  ?Abdominal:  ?   General: Bowel sounds are normal. There is no distension.  ?   Palpations: Abdomen is soft.  ?   Tenderness: There is no abdominal tenderness. There is no rebound.  ?Musculoskeletal:  ?   Cervical back: Normal range of motion.  ?Skin: ?   General: Skin is warm and dry.  ?Neurological:  ?   Mental Status: She is alert and oriented to person, place, and time.  ?   Coordination: Coordination normal.  ? ? ?Vitals:  ? 04/05/21 0906  ?BP: 112/70  ?Pulse: (!) 52  ?Resp: 18  ?SpO2: 99%  ?Weight: 122 lb 3.2 oz (55.4 kg)  ?Height: 5' 2.5" (1.588 m)  ? ?This visit occurred during the SARS-CoV-2 public health emergency.  Safety protocols were in place, including screening questions prior to the visit, additional usage of staff PPE, and extensive cleaning of exam room while observing appropriate contact time as indicated for disinfecting solutions.  ? ?Assessment & Plan:  ?Prevnar 20 given at visit ?

## 2021-04-05 NOTE — Assessment & Plan Note (Signed)
Taking breztri and theophylline 400 mg daily and albuterol prn. No flare today. Seeing pulmonary yearly.  ?

## 2021-04-05 NOTE — Assessment & Plan Note (Signed)
Checking vitamin D level. Taking boniva 150 mg monthly and dexa due 2024. ?

## 2021-04-07 ENCOUNTER — Other Ambulatory Visit: Payer: Self-pay | Admitting: *Deleted

## 2021-04-07 MED ORDER — BREZTRI AEROSPHERE 160-9-4.8 MCG/ACT IN AERO: 2.0000 | INHALATION_SPRAY | Freq: Two times a day (BID) | RESPIRATORY_TRACT | 3 refills | Status: DC

## 2021-04-24 DIAGNOSIS — M25561 Pain in right knee: Secondary | ICD-10-CM | POA: Diagnosis not present

## 2021-04-28 DIAGNOSIS — M25561 Pain in right knee: Secondary | ICD-10-CM | POA: Diagnosis not present

## 2021-05-01 DIAGNOSIS — M25561 Pain in right knee: Secondary | ICD-10-CM | POA: Diagnosis not present

## 2021-06-30 DIAGNOSIS — H5211 Myopia, right eye: Secondary | ICD-10-CM | POA: Diagnosis not present

## 2021-06-30 DIAGNOSIS — Z0100 Encounter for examination of eyes and vision without abnormal findings: Secondary | ICD-10-CM | POA: Diagnosis not present

## 2021-09-25 ENCOUNTER — Telehealth: Payer: Self-pay

## 2021-09-25 DIAGNOSIS — Z01419 Encounter for gynecological examination (general) (routine) without abnormal findings: Secondary | ICD-10-CM | POA: Diagnosis not present

## 2021-09-25 DIAGNOSIS — M810 Age-related osteoporosis without current pathological fracture: Secondary | ICD-10-CM | POA: Diagnosis not present

## 2021-09-25 DIAGNOSIS — Z6821 Body mass index (BMI) 21.0-21.9, adult: Secondary | ICD-10-CM | POA: Diagnosis not present

## 2021-09-25 DIAGNOSIS — Z1231 Encounter for screening mammogram for malignant neoplasm of breast: Secondary | ICD-10-CM | POA: Diagnosis not present

## 2021-09-25 NOTE — Telephone Encounter (Signed)
Left message pertaining to scheduling a mammogram. 

## 2021-10-02 DIAGNOSIS — M8580 Other specified disorders of bone density and structure, unspecified site: Secondary | ICD-10-CM | POA: Diagnosis not present

## 2021-10-04 ENCOUNTER — Other Ambulatory Visit: Payer: Self-pay

## 2021-10-04 DIAGNOSIS — M8580 Other specified disorders of bone density and structure, unspecified site: Secondary | ICD-10-CM | POA: Insufficient documentation

## 2021-10-04 DIAGNOSIS — M810 Age-related osteoporosis without current pathological fracture: Secondary | ICD-10-CM | POA: Insufficient documentation

## 2021-10-06 ENCOUNTER — Encounter: Payer: Self-pay | Admitting: Pulmonary Disease

## 2021-10-06 NOTE — Telephone Encounter (Addendum)
Auth Submission: APPROVED Payer: [employer] medicare Medication & CPT/J Code(s) submitted: Prolia (Denosumab) [id number] Route of submission (phone, fax, portal):  Phone [phone] Fax # [phone] Auth type: Buy/Bill Units/visits requested: 60mg  iv q6 months Reference number: [id number] Approval from: 10/06/21 to 10/07/22

## 2021-10-09 ENCOUNTER — Telehealth: Payer: Self-pay | Admitting: Pharmacy Technician

## 2021-10-09 DIAGNOSIS — H5211 Myopia, right eye: Secondary | ICD-10-CM | POA: Diagnosis not present

## 2021-10-09 DIAGNOSIS — H52221 Regular astigmatism, right eye: Secondary | ICD-10-CM | POA: Diagnosis not present

## 2021-10-09 DIAGNOSIS — H524 Presbyopia: Secondary | ICD-10-CM | POA: Diagnosis not present

## 2021-10-09 DIAGNOSIS — H18593 Other hereditary corneal dystrophies, bilateral: Secondary | ICD-10-CM | POA: Diagnosis not present

## 2021-10-09 NOTE — Telephone Encounter (Addendum)
Auth Submission: APPROVED Payer: [employer] Medication & CPT/J Code(s) submitted: reclast Route of submission (phone, fax, portal):  Phone # Fax # Auth type: Buy/Bill Units/visits requested: 60mg  Reference number: [id number] Approval from: 10/06/21 to 10/07/22  at [place] as Buy/Bill

## 2021-10-13 ENCOUNTER — Encounter: Payer: Self-pay | Admitting: Pulmonary Disease

## 2021-10-13 ENCOUNTER — Other Ambulatory Visit: Payer: Self-pay

## 2021-11-01 ENCOUNTER — Ambulatory Visit (INDEPENDENT_AMBULATORY_CARE_PROVIDER_SITE_OTHER): Payer: Medicare HMO

## 2021-11-01 VITALS — BP 133/76 | HR 116 | Temp 97.8°F | Resp 16 | Ht 62.0 in | Wt 124.0 lb

## 2021-11-01 DIAGNOSIS — M810 Age-related osteoporosis without current pathological fracture: Secondary | ICD-10-CM

## 2021-11-01 MED ORDER — ZOLEDRONIC ACID 5 MG/100ML IV SOLN
5.0000 mg | Freq: Once | INTRAVENOUS | Status: AC
  Administered 2021-11-01: 5 mg via INTRAVENOUS
  Filled 2021-11-01: qty 100

## 2021-11-01 MED ORDER — SODIUM CHLORIDE 0.9 % IV SOLN: INTRAVENOUS | Status: DC

## 2021-11-01 MED ORDER — ACETAMINOPHEN 325 MG PO TABS
650.0000 mg | ORAL_TABLET | Freq: Once | ORAL | Status: AC
  Administered 2021-11-01: 650 mg via ORAL
  Filled 2021-11-01: qty 2

## 2021-11-01 MED ORDER — DIPHENHYDRAMINE HCL 25 MG PO CAPS
25.0000 mg | ORAL_CAPSULE | Freq: Once | ORAL | Status: AC
  Administered 2021-11-01: 25 mg via ORAL
  Filled 2021-11-01: qty 1

## 2021-11-01 NOTE — Patient Instructions (Signed)

## 2021-11-01 NOTE — Progress Notes (Signed)
Diagnosis: Osteoporosis  Provider:  Marshell Garfinkel MD  Procedure: Infusion  IV Type: Peripheral, IV Location: R Antecubital  Reclast (Zolendronic Acid), Dose: 5 mg  Infusion Start Time: 8657  Infusion Stop Time: 1525  Post Infusion IV Care: Observation period completed and Peripheral IV Discontinued  Discharge: Condition: Good, Destination: Home . AVS provided to patient.   Performed by:  Cleophus Molt, RN

## 2021-12-15 NOTE — Progress Notes (Unsigned)
HPI F former smoker followed for allergic rhinitis, asthma 01/22/11-99%, 99%, 97%, 456 meters Not limited by oxygenation -CY PFT 01/22/2011-mild obstructive airways disease a slight response to bronchodilator, air trapping, normal diffusion. FEV1 2.18/95%, FEV1/FVC 0.65, FEF 25-75% 1.19/44%. RV 120%, DLCO 91% Office Spirometry 12/27/14- --------------------------------------------------------------------------  12/15/20- 66 year-old female former smoker followed for allergic rhinitis, asthma/COPD Neb albut, albuterol hfa, Breztri, theophylline 400,  Covid vax- 4 Moderna Flu vax- had ACT- 24 -----Patient feels some runny nose and some nasal congestion. Asthma well-controlled on Breo as tree and theophylline.  She does not want to stop theophylline.  Infrequent need for albuterol rescue inhaler. Discussed rhinorrhea.  She has started Zyrtec.  I suggested adding Flonase. 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  12/18/21-  70 year-old female former smoker followed for allergic rhinitis, asthma/COPD -Neb albut, albuterol hfa, Breztri, theophylline 400,  Covid vax- 6 Moderna Flu vax- had RSV vax- had Rare palpitation.  We discussed impact of her theophylline and her bronchodilators.  If she skips a day of theophylline she can feel it with increased chest tightness.  Otherwise has rarely needed rescue inhaler or nebulizer machine.  We discussed palpitations as possibly hinting paroxysmal atrial fibrillation and she will discuss with her PCP. Now has osteoporosis.  We discussed trying to drop back from [place] to [place] to remove the steroid component.  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   ROS-see HPI + = positive Constitutional:   No-   weight loss, night sweats, fevers, chills, fatigue, lassitude. HEENT:   No-  headaches, difficulty swallowing, tooth/dental problems, sore throat,       No-  sneezing, itching, ear ache, nasal  congestion, post nasal drip,  CV:  No-   chest pain, orthopnea, PND, swelling in lower extremities, anasarca, dizziness,    +palpitations Resp: +shortness of breath with exertion .            sign   productive cough,  No- non-productive cough,  No- coughing up of blood.              change in color of mucus.  No- wheezing.   Skin: No-   rash or lesions. GI:  No-   heartburn, indigestion, abdominal pain, nausea, vomiting,  GU:  MS:  +   joint pain or swelling.  Neuro-     nothing unusual Psych:  No- change in mood or affect. No depression or anxiety.  No memory loss.  OBJ General- Alert, Oriented, Affect-appropriate, Distress- none acute, trim Skin- rash-none, lesions- none, excoriation- none Lymphadenopathy- none Head- atraumatic            Eyes- Gross vision intact, PERRLA, conjunctivae clear secretions            Ears- Hearing, canals-normal            Nose- Clear, no-Septal dev, mucus, polyps, erosion, perforation             Throat- Mallampati II , mucosa clear , drainage- none, tonsils- atrophic Neck- flexible , trachea midline, no stridor , thyroid nl, carotid no bruit Chest - symmetrical excursion , unlabored           Heart/CV- RRR , no murmur , no gallop  , no rub, nl s1 s2                           -  JVD- none , edema- none, stasis changes- none, varices- none           Lung- Unlabored, clear, wheeze- none, cough- none , dullness-none, rub- none           Chest wall-  Abd-  Br/ Gen/ Rectal- Not done, not indicated Extrem- cyanosis- none, clubbing, none, atrophy- none, strength- nl Neuro- grossly intact to observation

## 2021-12-18 ENCOUNTER — Ambulatory Visit: Payer: Medicare HMO | Admitting: Internal Medicine

## 2021-12-18 ENCOUNTER — Encounter: Payer: Self-pay | Admitting: Internal Medicine

## 2021-12-18 VITALS — BP 122/60 | HR 71 | Ht 62.5 in | Wt 122.4 lb

## 2021-12-18 DIAGNOSIS — M810 Age-related osteoporosis without current pathological fracture: Secondary | ICD-10-CM | POA: Diagnosis not present

## 2021-12-18 DIAGNOSIS — J4540 Moderate persistent asthma, uncomplicated: Secondary | ICD-10-CM

## 2021-12-18 NOTE — Assessment & Plan Note (Signed)
Remains uncomplicated.  She has been stable on this regimen a long time but we discussed trying to drop off medications that might be aggravating side effects.  For now she does not want to stop theophylline and she will consider changing from [place] to [place] later.

## 2021-12-18 NOTE — Patient Instructions (Signed)
We will refill your albuterol rescue inhaler and your nebulizer solution.  When you are ready, let us know and we will send a script to replace Breztri with Bevespi, which is the same except that it doesn't have a steroid.

## 2021-12-20 ENCOUNTER — Encounter: Payer: Self-pay | Admitting: Internal Medicine

## 2021-12-20 MED ORDER — ALBUTEROL SULFATE HFA 108 (90 BASE) MCG/ACT IN AERS: INHALATION_SPRAY | RESPIRATORY_TRACT | 12 refills | Status: DC

## 2021-12-20 MED ORDER — ALBUTEROL SULFATE (2.5 MG/3ML) 0.083% IN NEBU: INHALATION_SOLUTION | RESPIRATORY_TRACT | 11 refills | Status: DC

## 2021-12-27 DIAGNOSIS — M5431 Sciatica, right side: Secondary | ICD-10-CM | POA: Diagnosis not present

## 2021-12-27 DIAGNOSIS — M25551 Pain in right hip: Secondary | ICD-10-CM | POA: Diagnosis not present

## 2022-01-23 ENCOUNTER — Encounter: Payer: Self-pay | Admitting: Internal Medicine

## 2022-01-23 ENCOUNTER — Other Ambulatory Visit: Payer: Self-pay | Admitting: Internal Medicine

## 2022-01-23 MED ORDER — BEVESPI AEROSPHERE 9-4.8 MCG/ACT IN AERO: 2.0000 | INHALATION_SPRAY | Freq: Two times a day (BID) | RESPIRATORY_TRACT | 4 refills | Status: DC

## 2022-01-23 NOTE — Telephone Encounter (Signed)
Bevespi sent to mail order

## 2022-01-24 DIAGNOSIS — M5431 Sciatica, right side: Secondary | ICD-10-CM | POA: Diagnosis not present

## 2022-01-24 DIAGNOSIS — M5432 Sciatica, left side: Secondary | ICD-10-CM | POA: Diagnosis not present

## 2022-01-24 DIAGNOSIS — M25551 Pain in right hip: Secondary | ICD-10-CM | POA: Diagnosis not present

## 2022-01-25 DIAGNOSIS — S76011D Strain of muscle, fascia and tendon of right hip, subsequent encounter: Secondary | ICD-10-CM | POA: Diagnosis not present

## 2022-01-25 DIAGNOSIS — M7601 Gluteal tendinitis, right hip: Secondary | ICD-10-CM | POA: Diagnosis not present

## 2022-01-25 DIAGNOSIS — M25551 Pain in right hip: Secondary | ICD-10-CM | POA: Diagnosis not present

## 2022-01-30 DIAGNOSIS — M7601 Gluteal tendinitis, right hip: Secondary | ICD-10-CM | POA: Diagnosis not present

## 2022-01-30 DIAGNOSIS — S76011D Strain of muscle, fascia and tendon of right hip, subsequent encounter: Secondary | ICD-10-CM | POA: Diagnosis not present

## 2022-01-30 DIAGNOSIS — M25551 Pain in right hip: Secondary | ICD-10-CM | POA: Diagnosis not present

## 2022-02-02 DIAGNOSIS — M25551 Pain in right hip: Secondary | ICD-10-CM | POA: Diagnosis not present

## 2022-02-02 DIAGNOSIS — S76011D Strain of muscle, fascia and tendon of right hip, subsequent encounter: Secondary | ICD-10-CM | POA: Diagnosis not present

## 2022-02-02 DIAGNOSIS — M7601 Gluteal tendinitis, right hip: Secondary | ICD-10-CM | POA: Diagnosis not present

## 2022-02-06 DIAGNOSIS — M25551 Pain in right hip: Secondary | ICD-10-CM | POA: Diagnosis not present

## 2022-02-06 DIAGNOSIS — M7601 Gluteal tendinitis, right hip: Secondary | ICD-10-CM | POA: Diagnosis not present

## 2022-02-06 DIAGNOSIS — S76011D Strain of muscle, fascia and tendon of right hip, subsequent encounter: Secondary | ICD-10-CM | POA: Diagnosis not present

## 2022-02-08 DIAGNOSIS — M25551 Pain in right hip: Secondary | ICD-10-CM | POA: Diagnosis not present

## 2022-02-08 DIAGNOSIS — S76011D Strain of muscle, fascia and tendon of right hip, subsequent encounter: Secondary | ICD-10-CM | POA: Diagnosis not present

## 2022-02-08 DIAGNOSIS — M7601 Gluteal tendinitis, right hip: Secondary | ICD-10-CM | POA: Diagnosis not present

## 2022-02-14 DIAGNOSIS — J4489 Other specified chronic obstructive pulmonary disease: Secondary | ICD-10-CM | POA: Diagnosis not present

## 2022-02-14 DIAGNOSIS — R030 Elevated blood-pressure reading, without diagnosis of hypertension: Secondary | ICD-10-CM | POA: Diagnosis not present

## 2022-02-14 DIAGNOSIS — F325 Major depressive disorder, single episode, in full remission: Secondary | ICD-10-CM | POA: Diagnosis not present

## 2022-02-14 DIAGNOSIS — R69 Illness, unspecified: Secondary | ICD-10-CM | POA: Diagnosis not present

## 2022-02-14 DIAGNOSIS — J45990 Exercise induced bronchospasm: Secondary | ICD-10-CM | POA: Diagnosis not present

## 2022-02-14 DIAGNOSIS — Z96649 Presence of unspecified artificial hip joint: Secondary | ICD-10-CM | POA: Diagnosis not present

## 2022-02-14 DIAGNOSIS — Z809 Family history of malignant neoplasm, unspecified: Secondary | ICD-10-CM | POA: Diagnosis not present

## 2022-02-14 DIAGNOSIS — Z8249 Family history of ischemic heart disease and other diseases of the circulatory system: Secondary | ICD-10-CM | POA: Diagnosis not present

## 2022-02-14 DIAGNOSIS — Z87891 Personal history of nicotine dependence: Secondary | ICD-10-CM | POA: Diagnosis not present

## 2022-02-15 DIAGNOSIS — M7601 Gluteal tendinitis, right hip: Secondary | ICD-10-CM | POA: Diagnosis not present

## 2022-02-15 DIAGNOSIS — M25551 Pain in right hip: Secondary | ICD-10-CM | POA: Diagnosis not present

## 2022-02-15 DIAGNOSIS — S76011D Strain of muscle, fascia and tendon of right hip, subsequent encounter: Secondary | ICD-10-CM | POA: Diagnosis not present

## 2022-02-20 DIAGNOSIS — S76011D Strain of muscle, fascia and tendon of right hip, subsequent encounter: Secondary | ICD-10-CM | POA: Diagnosis not present

## 2022-02-20 DIAGNOSIS — M25551 Pain in right hip: Secondary | ICD-10-CM | POA: Diagnosis not present

## 2022-02-20 DIAGNOSIS — M7601 Gluteal tendinitis, right hip: Secondary | ICD-10-CM | POA: Diagnosis not present

## 2022-02-22 DIAGNOSIS — S76011D Strain of muscle, fascia and tendon of right hip, subsequent encounter: Secondary | ICD-10-CM | POA: Diagnosis not present

## 2022-02-22 DIAGNOSIS — M7601 Gluteal tendinitis, right hip: Secondary | ICD-10-CM | POA: Diagnosis not present

## 2022-02-22 DIAGNOSIS — M25551 Pain in right hip: Secondary | ICD-10-CM | POA: Diagnosis not present

## 2022-02-27 DIAGNOSIS — M25551 Pain in right hip: Secondary | ICD-10-CM | POA: Diagnosis not present

## 2022-02-27 DIAGNOSIS — S76011D Strain of muscle, fascia and tendon of right hip, subsequent encounter: Secondary | ICD-10-CM | POA: Diagnosis not present

## 2022-02-27 DIAGNOSIS — M7601 Gluteal tendinitis, right hip: Secondary | ICD-10-CM | POA: Diagnosis not present

## 2022-02-28 DIAGNOSIS — M5431 Sciatica, right side: Secondary | ICD-10-CM | POA: Diagnosis not present

## 2022-02-28 DIAGNOSIS — M25551 Pain in right hip: Secondary | ICD-10-CM | POA: Diagnosis not present

## 2022-03-06 DIAGNOSIS — M545 Low back pain, unspecified: Secondary | ICD-10-CM | POA: Diagnosis not present

## 2022-03-06 DIAGNOSIS — M25551 Pain in right hip: Secondary | ICD-10-CM | POA: Diagnosis not present

## 2022-03-07 ENCOUNTER — Ambulatory Visit (INDEPENDENT_AMBULATORY_CARE_PROVIDER_SITE_OTHER): Payer: Medicare HMO | Admitting: Emergency Medicine

## 2022-03-07 ENCOUNTER — Encounter: Payer: Self-pay | Admitting: Emergency Medicine

## 2022-03-07 VITALS — BP 118/76 | HR 70 | Temp 98.1°F | Ht 62.5 in | Wt 120.4 lb

## 2022-03-07 DIAGNOSIS — R6889 Other general symptoms and signs: Secondary | ICD-10-CM | POA: Diagnosis not present

## 2022-03-07 DIAGNOSIS — J4540 Moderate persistent asthma, uncomplicated: Secondary | ICD-10-CM | POA: Diagnosis not present

## 2022-03-07 DIAGNOSIS — J988 Other specified respiratory disorders: Secondary | ICD-10-CM

## 2022-03-07 DIAGNOSIS — B9789 Other viral agents as the cause of diseases classified elsewhere: Secondary | ICD-10-CM | POA: Diagnosis not present

## 2022-03-07 NOTE — Assessment & Plan Note (Signed)
Running its course without complications. Clinical stable.  No red flag signs or symptoms. Continue TheraFlu daytime and nighttime Advised to rest and stay well-hydrated Advised to contact the office if no better or worse during the next several days.

## 2022-03-07 NOTE — Progress Notes (Signed)
CHLOEMARIE SWATSWORTH 67 y.o.   Chief Complaint  Patient presents with   Acute Visit    Cough, chest congestion, nasal congestion     HISTORY OF PRESENT ILLNESS: Acute problem visit today This is a 67 y.o. female complaining of like symptoms that started 5 days ago with sore throat followed by congestion followed by coughing.  Has been taking TheraFlu daytime and nighttime.  Today feels a lot better. Denies difficulty breathing or wheezing. No other symptoms. No other complaints or medical concerns today.  HPI   Prior to Admission medications   Medication Sig Start Date End Date Taking? Authorizing Provider  albuterol (PROVENTIL) (2.5 MG/3ML) 0.083% nebulizer solution INHALE THE CONTENTS OF 1   VIAL VIA NEBULIZER 4 TIMES A DAY AS NEEDED 12/20/21  Yes Young, Clinton D, MD  albuterol (VENTOLIN HFA) 108 (90 Base) MCG/ACT inhaler TAKE 2 PUFFS BY MOUTH EVERY 6 HOURS AS NEEDED FOR WHEEZE OR SHORTNESS OF BREATH 12/20/21  Yes Young, Clinton D, MD  Glycopyrrolate-Formoterol (BEVESPI AEROSPHERE) 9-4.8 MCG/ACT AERO Inhale 2 puffs into the lungs 2 (two) times daily. 01/23/22  Yes Young, Tarri Fuller D, MD  theophylline (UNIPHYL) 400 MG 24 hr tablet TAKE 1 TABLET DAILY 01/23/22  Yes Deneise Lever, MD    Allergies  Allergen Reactions   Other Other (See Comments)    Other reaction(s): Wheezing Pet dander   Pollen Extract     Other reaction(s): Other    Patient Active Problem List   Diagnosis Date Noted   OP (osteoporosis) 10/04/2021   Grief 08/27/2016   Routine general medical examination at a health care facility 01/21/2015   Osteopenia    Seasonal and perennial allergic rhinitis 12/11/2006   Chronic asthma, moderate persistent, uncomplicated [id number]    Past Medical History:  Diagnosis Date   Allergic rhinitis    Anxiety    Arthritis    Asthma    COPD bronchitis    Dyspnea    with exertion   Headache    due to neck psin   Heart murmur    slight  no cardiology   Mild mitral  regurgitation by prior echocardiogram 12/17/06   Osteopenia    DEXA @ gyn 11/909/10: -2.0 DEXA @ gyn 11/20/10: -1.8 DEXA @ gyn 11/20/12: -2.2   Shingles 2012    Past Surgical History:  Procedure Laterality Date   ANTERIOR CERVICAL DECOMP/DISCECTOMY FUSION N/A 02/23/2016   Procedure: ANTERIOR CERVICAL DECOMPRESSION/DISCECTOMY C5-6;  Surgeon: Melina Schools, MD;  Location: [facility];  Service: Orthopedics;  Laterality: N/A;  Requests 2.5 hrs   EYE SURGERY Bilateral    cataract surgery   2014    Social History   Socioeconomic History   Marital status: Married    Spouse name: Not on file   Number of children: Not on file   Years of education: Not on file   Highest education level: Not on file  Occupational History   Occupation: [employer]  Tobacco Use   Smoking status: Former    Packs/day: 3.00    Years: 15.00    Total pack years: 45.00    Types: Cigarettes    Quit date: 01/08/1985    Years since quitting: 37.1   Smokeless tobacco: Never  Vaping Use   Vaping Use: Never used  Substance and Sexual Activity   Alcohol use: No    Alcohol/week: 0.0 standard drinks of alcohol   Drug use: No   Sexual activity: Not on file  Other Topics Concern   Not  on file  Social History Narrative   Not on file   Social Determinants of Health   Financial Resource Strain: Not on file  Food Insecurity: Not on file  Transportation Needs: Not on file  Physical Activity: Not on file  Stress: Not on file  Social Connections: Not on file  Intimate Partner Violence: Not on file    Family History  Problem Relation Age of Onset   Hypertension Mother        smoker   Coronary artery disease Father        started in 18s   Coronary artery disease Other        female 1 st degree relative < 50-father     Review of Systems  Constitutional: Negative.  Negative for chills and fever.  HENT:  Positive for congestion and sore throat.   Respiratory:  Positive for cough and sputum production. Negative for  shortness of breath and wheezing.   Cardiovascular: Negative.  Negative for chest pain and palpitations.  Gastrointestinal: Negative.  Negative for abdominal pain, diarrhea, nausea and vomiting.  Genitourinary: Negative.  Negative for dysuria and hematuria.  Neurological: Negative.  Negative for dizziness and headaches.  All other systems reviewed and are negative.   Today's Vitals   03/07/22 1416  BP: 118/76  Pulse: 70  Temp: 98.1 F (36.7 C)  TempSrc: Oral  SpO2: 95%  Weight: 120 lb 6 oz (54.6 kg)  Height: 5' 2.5" (1.588 m)   Body mass index is 21.67 kg/m.  Physical Exam Vitals reviewed.  Constitutional:      Appearance: Normal appearance.  HENT:     Head: Normocephalic.     Right Ear: Tympanic membrane, ear canal and external ear normal.     Left Ear: Tympanic membrane, ear canal and external ear normal.     Mouth/Throat:     Mouth: Mucous membranes are moist.     Pharynx: Oropharynx is clear.  Eyes:     Extraocular Movements: Extraocular movements intact.     Pupils: Pupils are equal, round, and reactive to light.  Cardiovascular:     Rate and Rhythm: Normal rate and regular rhythm.     Pulses: Normal pulses.     Heart sounds: Normal heart sounds.  Pulmonary:     Effort: Pulmonary effort is normal.  Musculoskeletal:        General: Normal range of motion.     Cervical back: No tenderness.  Lymphadenopathy:     Cervical: No cervical adenopathy.  Skin:    General: Skin is warm and dry.  Neurological:     Mental Status: She is alert and oriented to person, place, and time.  Psychiatric:        Mood and Affect: Mood normal.        Behavior: Behavior normal.      ASSESSMENT & PLAN: Problem List Items Addressed This Visit       Respiratory   Chronic asthma, moderate persistent, uncomplicated    Stable.  Not complicated by viral infection. No acute exacerbation.      Viral respiratory infection - Primary    Running its course without  complications. Clinical stable.  No red flag signs or symptoms. Continue TheraFlu daytime and nighttime Advised to rest and stay well-hydrated Advised to contact the office if no better or worse during the next several days.        Other   Flu-like symptoms    Continue over-the-counter TheraFlu Advised to stay well-hydrated  and rest Contact the office if no better or worse during the next several days.      Patient Instructions  Viral Respiratory Infection A viral respiratory infection is an illness that affects parts of the body that are used for breathing. These include the lungs, nose, and throat. It is caused by a germ called a virus. Some examples of this kind of infection are: A cold. The flu (influenza). A respiratory syncytial virus (RSV) infection. What are the causes? This condition is caused by a virus. It spreads from person to person. You can get the virus if: You breathe in droplets from someone who is sick. You come in contact with people who are sick. You touch mucus or other fluid from a person who is sick. What are the signs or symptoms? Symptoms of this condition include: A stuffy or runny nose. A sore throat. A cough. Shortness of breath. Trouble breathing. Yellow or green fluid in the nose. Other symptoms may include: A fever. Sweating or chills. Tiredness (fatigue). Achy muscles. A headache. How is this treated? This condition may be treated with: Medicines that treat viruses. Medicines that make it easy to breathe. Medicines that are sprayed into the nose. Acetaminophen or NSAIDs, such as ibuprofen, to treat fever. Follow these instructions at home: Managing pain and congestion Take over-the-counter and prescription medicines only as told by your doctor. If you have a sore throat, gargle with salt water. Do this 3-4 times a day or as needed. To make salt water, dissolve -1 tsp (3-6 g) of salt in 1 cup (237 mL) of warm water. Make sure that  all the salt dissolves. Use nose drops made from salt water. This helps with stuffiness (congestion). It also helps soften the skin around your nose. Take 2 tsp (10 mL) of honey at bedtime to lessen coughing at night. Do not give honey to children who are younger than 77 year old. Drink enough fluid to keep your pee (urine) pale yellow. General instructions  Rest as much as possible. Do not drink alcohol. Do not smoke or use any products that contain nicotine or tobacco. If you need help quitting, ask your doctor. Keep all follow-up visits. How is this prevented?     Get a flu shot every year. Ask your doctor when you should get your flu shot. Do not let other people get your germs. If you are sick: Wash your hands with soap and water often. Wash your hands after you cough or sneeze. Wash hands for at least 20 seconds. If you cannot use soap and water, use hand sanitizer. Cover your mouth when you cough. Cover your nose and mouth when you sneeze. Do not share cups or eating utensils. Clean commonly used objects often. Clean commonly touched surfaces. Stay home from work or school. Avoid contact with people who are sick during cold and flu season. This is in fall and winter. Get help if: Your symptoms last for 10 days or longer. Your symptoms get worse over time. You have very bad pain in your face or forehead. Parts of your jaw or neck get very swollen. You have shortness of breath. Get help right away if: You feel pain or pressure in your chest. You have trouble breathing. You faint or feel like you will faint. You keep vomiting and it gets worse. You feel confused. These symptoms may be an emergency. Get help right away. Call your local emergency services (911 in the [place] Do not wait to see  if the symptoms will go away. Do not drive yourself to the hospital. Summary A viral respiratory infection is an illness that affects parts of the body that are used for  breathing. Examples of this illness include a cold, the flu, and a respiratory syncytial virus (RSV) infection. The infection can cause a runny nose, cough, sore throat, and fever. Follow what your doctor tells you about taking medicines, drinking lots of fluid, washing your hands, resting at home, and avoiding people who are sick. This information is not intended to replace advice given to you by your health care provider. Make sure you discuss any questions you have with your health care provider. Document Revised: 03/31/2020 Document Reviewed: 03/31/2020 Elsevier Patient Education  [place], MD [facility]

## 2022-03-07 NOTE — Assessment & Plan Note (Signed)
Stable.  Not complicated by viral infection. No acute exacerbation.

## 2022-03-07 NOTE — Assessment & Plan Note (Signed)
Continue over-the-counter TheraFlu Advised to stay well-hydrated and rest Contact the office if no better or worse during the next several days.

## 2022-03-07 NOTE — Patient Instructions (Signed)

## 2022-03-16 DIAGNOSIS — M545 Low back pain, unspecified: Secondary | ICD-10-CM | POA: Diagnosis not present

## 2022-03-16 DIAGNOSIS — M25551 Pain in right hip: Secondary | ICD-10-CM | POA: Diagnosis not present

## 2022-04-02 ENCOUNTER — Telehealth: Payer: Self-pay

## 2022-04-02 NOTE — Telephone Encounter (Signed)
[facility] to schedule their annual wellness visit. Patient declined to schedule AWV at this time.  PCP will complete during her CPE on 04/09/22  Norton Blizzard, [place] Deborra Medina)  [facility] [phone]

## 2022-04-09 ENCOUNTER — Ambulatory Visit (INDEPENDENT_AMBULATORY_CARE_PROVIDER_SITE_OTHER): Payer: Medicare HMO | Admitting: Internal Medicine

## 2022-04-09 ENCOUNTER — Encounter: Payer: Self-pay | Admitting: Internal Medicine

## 2022-04-09 VITALS — BP 100/60 | HR 86 | Temp 98.3°F | Ht 62.5 in | Wt 120.2 lb

## 2022-04-09 DIAGNOSIS — J4540 Moderate persistent asthma, uncomplicated: Secondary | ICD-10-CM

## 2022-04-09 DIAGNOSIS — M810 Age-related osteoporosis without current pathological fracture: Secondary | ICD-10-CM

## 2022-04-09 DIAGNOSIS — Z0000 Encounter for general adult medical examination without abnormal findings: Secondary | ICD-10-CM | POA: Diagnosis not present

## 2022-04-09 LAB — COMPREHENSIVE METABOLIC PANEL
ALT: 17 U/L (ref 0–35)
AST: 18 U/L (ref 0–37)
Albumin: 4 g/dL (ref 3.5–5.2)
Alkaline Phosphatase: 54 U/L (ref 39–117)
BUN: 19 mg/dL (ref 6–23)
CO2: 27 mEq/L (ref 19–32)
Calcium: 9.2 mg/dL (ref 8.4–10.5)
Chloride: 108 mEq/L (ref 96–112)
Creatinine, Ser: 0.77 mg/dL (ref 0.40–1.20)
GFR: 80.39 mL/min (ref >60.00)
Glucose, Bld: 89 mg/dL (ref 70–99)
Potassium: 4.1 mEq/L (ref 3.5–5.1)
Sodium: 138 mEq/L (ref 135–145)
Total Bilirubin: 0.4 mg/dL (ref 0.2–1.2)
Total Protein: 6.1 g/dL (ref 6.0–8.3)

## 2022-04-09 LAB — CBC
HCT: 37.8 % (ref 36.0–46.0)
Hemoglobin: 12.2 g/dL (ref 12.0–15.0)
MCHC: 32.4 g/dL (ref 30.0–36.0)
MCV: 79.7 fl (ref 78.0–100.0)
Platelets: 234 10*3/uL (ref 150.0–400.0)
RBC: 4.75 Mil/uL (ref 3.87–5.11)
RDW: 14.8 % (ref 11.5–15.5)
WBC: 5.2 10*3/uL (ref 4.0–10.5)

## 2022-04-09 LAB — LIPID PANEL
Cholesterol: 172 mg/dL (ref 0–200)
HDL: 84.8 mg/dL (ref >39.00)
LDL Cholesterol: 78 mg/dL (ref 0–99)
NonHDL: 87.16
Total CHOL/HDL Ratio: 2
Triglycerides: 46 mg/dL (ref 0.0–149.0)
VLDL: 9.2 mg/dL (ref 0.0–40.0)

## 2022-04-09 LAB — TSH: TSH: 1 u[IU]/mL (ref 0.35–5.50)

## 2022-04-09 LAB — VITAMIN D 25 HYDROXY (VIT D DEFICIENCY, FRACTURES): VITD: 40.4 ng/mL (ref 30.00–100.00)

## 2022-04-09 NOTE — Assessment & Plan Note (Signed)
Flu shot up to date. Covid-19 counseled. Pneumonia complete. Shingrix complete. Tetanus due 2030. Colonoscopy due 2029. Mammogram due 2025, pap smear aged out and dexa due 2026. Counseled about sun safety and mole surveillance. Counseled about the dangers of distracted driving. Given 10 year screening recommendations.

## 2022-04-09 NOTE — Assessment & Plan Note (Signed)
Checking CMP and vitamin D. Adjust as needed. She is staying active and wants to monitor off treatment for now.

## 2022-04-09 NOTE — Patient Instructions (Signed)
The EKG looks normal and the same as before.

## 2022-04-09 NOTE — Progress Notes (Signed)
Subjective:   Patient ID: Jessica Richards, female    DOB: [birth year], 67 y.o.   MRN: [id number]  HPI Here for medicare wellness and physical, no new complaints. Please see A/P for status and treatment of chronic medical problems.   Diet: heart healthy Physical activity: active daily walking or biking Depression/mood screen: negative Hearing: intact to whispered voice Visual acuity: grossly normal with lens, performs annual eye exam  ADLs: capable Fall risk: none Home safety: good Cognitive evaluation: intact to orientation, naming, recall and repetition EOL planning: adv directives discussed  [place] Visit from 04/09/2022 in [facility] at [facility] Visit from 04/09/2022 in [facility] at [facility]  PHQ-9 Total Score 2         02/06/2018    1:03 PM 11/23/2019    1:45 PM 04/05/2021    9:10 AM 03/07/2022    2:16 PM 04/09/2022    9:03 AM  [place] in the past year? 0 0 0 0 0  Was there an injury with Fall?  0 0 0 0  Fall Risk Category Calculator  0 0 0 0  Fall Risk Category (Retired)  Low Low    (RETIRED) Patient Fall Risk Level  Low fall risk     Patient at Risk for Falls Due to    No Fall Risks   Fall risk Follow up    Falls evaluation completed Falls evaluation completed    I have personally reviewed and have noted 1. The patient's medical and social history - reviewed today no changes 2. Their use of alcohol, tobacco or illicit drugs 3. Their current medications and supplements 4. The patient's functional ability including ADL's, fall risks, home safety risks and hearing or visual impairment. 5. Diet and physical activities 6. Evidence for depression or mood disorders 7. Care team reviewed and updated 8.  The patient is not on an opioid pain medication.  Patient Care Team: Hoyt Koch, MD as PCP - General (Internal Medicine) Past Medical History:   Diagnosis Date   Allergic rhinitis    Anxiety    Arthritis    Asthma    COPD bronchitis    Dyspnea    with exertion   Headache    due to neck psin   Heart murmur    slight  no cardiology   Mild mitral regurgitation by prior echocardiogram 12/17/06   Osteopenia    DEXA @ gyn 11/909/10: -2.0 DEXA @ gyn 11/20/10: -1.8 DEXA @ gyn 11/20/12: -2.2   Shingles 2012   Past Surgical History:  Procedure Laterality Date   ANTERIOR CERVICAL DECOMP/DISCECTOMY FUSION N/A 02/23/2016   Procedure: ANTERIOR CERVICAL DECOMPRESSION/DISCECTOMY C5-6;  Surgeon: Melina Schools, MD;  Location: [facility];  Service: Orthopedics;  Laterality: N/A;  Requests 2.5 hrs   EYE SURGERY Bilateral    cataract surgery   2014   Family History  Problem Relation Age of Onset   Hypertension Mother        smoker   Coronary artery disease Father        started in 25s   Coronary artery disease Other        female 1 st degree relative < 50-father   Review of Systems  Constitutional: Negative.   HENT: Negative.    Eyes: Negative.   Respiratory:  Negative for cough, chest tightness and shortness of breath.  Cardiovascular:  Negative for chest pain, palpitations and leg swelling.  Gastrointestinal:  Negative for abdominal distention, abdominal pain, constipation, diarrhea, nausea and vomiting.  Musculoskeletal: Negative.   Skin: Negative.   Neurological: Negative.   Psychiatric/Behavioral: Negative.      Objective:  Physical Exam Constitutional:      Appearance: She is well-developed.  HENT:     Head: Normocephalic and atraumatic.  Cardiovascular:     Rate and Rhythm: Normal rate and regular rhythm.  Pulmonary:     Effort: Pulmonary effort is normal. No respiratory distress.     Breath sounds: Normal breath sounds. No wheezing or rales.  Abdominal:     General: Bowel sounds are normal. There is no distension.     Palpations: Abdomen is soft.     Tenderness: There is no abdominal tenderness. There is no rebound.   Musculoskeletal:     Cervical back: Normal range of motion.  Skin:    General: Skin is warm and dry.  Neurological:     Mental Status: She is alert and oriented to person, place, and time.     Coordination: Coordination normal.     Vitals:   04/09/22 0857  BP: 100/60  Pulse: 86  Temp: 98.3 F (36.8 C)  TempSrc: Oral  SpO2: 98%  Weight: 120 lb 4 oz (54.5 kg)  Height: 5' 2.5" (1.588 m)  EKG: Rate 46, axis normal, interval normal, sinus brady, no st or t wave changes, no significant change compared to prior 2020   Assessment & Plan:

## 2022-04-09 NOTE — Assessment & Plan Note (Signed)
Checking TSH, CMP, CBC and adjust as needed. EKG done to check for changes which is stable. No flare today and seeing pulmonary due to theophylline 400 mg daily and bevespi and albuterol prn. Moderate persistent.

## 2022-04-17 DIAGNOSIS — M7071 Other bursitis of hip, right hip: Secondary | ICD-10-CM | POA: Diagnosis not present

## 2022-04-23 DIAGNOSIS — H52221 Regular astigmatism, right eye: Secondary | ICD-10-CM | POA: Diagnosis not present

## 2022-04-23 DIAGNOSIS — H5211 Myopia, right eye: Secondary | ICD-10-CM | POA: Diagnosis not present

## 2022-04-23 DIAGNOSIS — H26492 Other secondary cataract, left eye: Secondary | ICD-10-CM | POA: Diagnosis not present

## 2022-04-23 DIAGNOSIS — H43392 Other vitreous opacities, left eye: Secondary | ICD-10-CM | POA: Diagnosis not present

## 2022-04-23 DIAGNOSIS — Z9841 Cataract extraction status, right eye: Secondary | ICD-10-CM | POA: Diagnosis not present

## 2022-04-23 DIAGNOSIS — Z961 Presence of intraocular lens: Secondary | ICD-10-CM | POA: Diagnosis not present

## 2022-04-23 DIAGNOSIS — H524 Presbyopia: Secondary | ICD-10-CM | POA: Diagnosis not present

## 2022-05-02 DIAGNOSIS — M7061 Trochanteric bursitis, right hip: Secondary | ICD-10-CM | POA: Diagnosis not present

## 2022-06-13 DIAGNOSIS — M7062 Trochanteric bursitis, left hip: Secondary | ICD-10-CM | POA: Diagnosis not present

## 2022-07-03 DIAGNOSIS — H18413 Arcus senilis, bilateral: Secondary | ICD-10-CM | POA: Diagnosis not present

## 2022-07-03 DIAGNOSIS — H26492 Other secondary cataract, left eye: Secondary | ICD-10-CM | POA: Diagnosis not present

## 2022-07-03 DIAGNOSIS — H02831 Dermatochalasis of right upper eyelid: Secondary | ICD-10-CM | POA: Diagnosis not present

## 2022-07-03 DIAGNOSIS — Z961 Presence of intraocular lens: Secondary | ICD-10-CM | POA: Diagnosis not present

## 2022-07-10 DIAGNOSIS — H2512 Age-related nuclear cataract, left eye: Secondary | ICD-10-CM | POA: Diagnosis not present

## 2022-07-10 DIAGNOSIS — Z9842 Cataract extraction status, left eye: Secondary | ICD-10-CM | POA: Diagnosis not present

## 2022-07-10 DIAGNOSIS — Z961 Presence of intraocular lens: Secondary | ICD-10-CM | POA: Diagnosis not present

## 2022-07-10 DIAGNOSIS — H18593 Other hereditary corneal dystrophies, bilateral: Secondary | ICD-10-CM | POA: Diagnosis not present

## 2022-07-17 DIAGNOSIS — M7062 Trochanteric bursitis, left hip: Secondary | ICD-10-CM | POA: Diagnosis not present

## 2022-07-17 DIAGNOSIS — M7072 Other bursitis of hip, left hip: Secondary | ICD-10-CM | POA: Diagnosis not present

## 2022-07-17 DIAGNOSIS — M7061 Trochanteric bursitis, right hip: Secondary | ICD-10-CM | POA: Diagnosis not present

## 2022-07-20 DIAGNOSIS — M25562 Pain in left knee: Secondary | ICD-10-CM | POA: Diagnosis not present

## 2022-07-26 DIAGNOSIS — M25562 Pain in left knee: Secondary | ICD-10-CM | POA: Diagnosis not present

## 2022-08-03 DIAGNOSIS — M25562 Pain in left knee: Secondary | ICD-10-CM | POA: Diagnosis not present

## 2022-09-28 DIAGNOSIS — Z6821 Body mass index (BMI) 21.0-21.9, adult: Secondary | ICD-10-CM | POA: Diagnosis not present

## 2022-09-28 DIAGNOSIS — Z01419 Encounter for gynecological examination (general) (routine) without abnormal findings: Secondary | ICD-10-CM | POA: Diagnosis not present

## 2022-09-28 DIAGNOSIS — Z1231 Encounter for screening mammogram for malignant neoplasm of breast: Secondary | ICD-10-CM | POA: Diagnosis not present

## 2022-09-28 LAB — HM MAMMOGRAPHY

## 2022-10-01 DIAGNOSIS — H43311 Vitreous membranes and strands, right eye: Secondary | ICD-10-CM | POA: Diagnosis not present

## 2022-10-01 DIAGNOSIS — H43312 Vitreous membranes and strands, left eye: Secondary | ICD-10-CM | POA: Diagnosis not present

## 2022-10-01 DIAGNOSIS — H02831 Dermatochalasis of right upper eyelid: Secondary | ICD-10-CM | POA: Diagnosis not present

## 2022-10-01 DIAGNOSIS — H18413 Arcus senilis, bilateral: Secondary | ICD-10-CM | POA: Diagnosis not present

## 2022-10-08 DIAGNOSIS — H2511 Age-related nuclear cataract, right eye: Secondary | ICD-10-CM | POA: Diagnosis not present

## 2022-10-09 LAB — HM PAP SMEAR: HPV, high-risk: NEGATIVE

## 2022-11-05 DIAGNOSIS — M25562 Pain in left knee: Secondary | ICD-10-CM | POA: Diagnosis not present

## 2022-12-19 NOTE — Progress Notes (Unsigned)
HPI F former smoker followed for allergic rhinitis, asthma 01/22/11-99%, 99%, 97%, 456 meters Not limited by oxygenation -CY PFT 01/22/2011-mild obstructive airways disease a slight response to bronchodilator, air trapping, normal diffusion. FEV1 2.18/95%, FEV1/FVC 0.65, FEF 25-75% 1.19/44%. RV 120%, DLCO 91% Office Spirometry 12/27/14- --------------------------------------------------------------------------  12/18/21-  67 year-old female former smoker followed for allergic rhinitis, asthma/COPD -Neb albut, albuterol hfa, Breztri, theophylline 400,  Covid vax- 6 Moderna Flu vax- had RSV vax- had Rare palpitation.  We discussed impact of her theophylline and her bronchodilators.  If she skips a day of theophylline she can feel it with increased chest tightness.  Otherwise has rarely needed rescue inhaler or nebulizer machine.  We discussed palpitations as possibly hinting paroxysmal atrial fibrillation and she will discuss with her PCP. Now has osteoporosis.  We discussed trying to drop back from [place] to [place] to remove the steroid component.  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  12/20/22- 67 year-old female former smoker (45pkyrs) followed for allergic rhinitis, asthma/COPD, complicated by Osteoporosis -Neb albut, albuterol hfa, Bevespi, theophylline 400, Discussed the use of AI scribe software for clinical note transcription with the patient, who gave verbal consent to proceed.  History of Present Illness   The patient, with a history of heavy smoking (three packs a day for about fifteen years), presents with a long-standing diagnosis of Asthma/COPD. She quit smoking a long time ago and has been managing her respiratory symptoms with inhalers. She was previously on Breztri, a triple therapy inhaler, but switched to Bevespi, a dual therapy inhaler without steroids, due to concerns about osteoporosis. She reports that it took a while to get used  to the [place], initially feeling that her lungs were not as deeply clear as with the [place]. However, she reports that this issue resolved after continued use and she is currently doing okay on Bevespi, despite feeling that Markus Daft was probably a little bit better.  The patient also has osteoporosis and underwent Reclast treatment last year, which she reports caused side effects. She is due for a bone scan later this month to assess her bone health. She expresses uncertainty about whether being a year off of inhaled steroids will show a difference on the bone scan. This is being managed through her gynecologist.  Additionally, the patient had some palpitations last year. An EKG was performed by her primary care physician, which was reported as normal. She does not report any progressive symptoms related to this.  The patient remains active, going to the gym, riding her bike, and even [place] the beach on [place] bike [place]. She also reports having had to have two knee shots for a torn lateral meniscus, which was not bad enough for surgery. She reports that her knee is now better, although recovery took a long time.     ROS-see HPI + = positive Constitutional:   No-   weight loss, night sweats, fevers, chills, fatigue, lassitude. HEENT:   No-  headaches, difficulty swallowing, tooth/dental problems, sore throat,       No-  sneezing, itching, ear ache, nasal congestion, post nasal drip,  CV:  No-   chest pain, orthopnea, PND, swelling in lower extremities, anasarca, dizziness, +palpitations Resp: +shortness of breath with exertion .            sign   productive cough,  No- non-productive cough,  No- coughing up of blood.  change in color of mucus.  No- wheezing.   Skin: No-   rash or lesions. GI:  No-   heartburn, indigestion, abdominal pain, nausea, vomiting,  GU:  MS:  +   joint pain or swelling.  Neuro-     nothing unusual Psych:  No- change in mood or affect. No  depression or anxiety.  No memory loss.  OBJ General- Alert, Oriented, Affect-appropriate, Distress- none acute, trim Skin- rash-none, lesions- none, excoriation- none Lymphadenopathy- none Head- atraumatic            Eyes- Gross vision intact, PERRLA, conjunctivae clear secretions            Ears- Hearing, canals-normal            Nose- Clear, no-Septal dev, mucus, polyps, erosion, perforation             Throat- Mallampati II , mucosa clear , drainage- none, tonsils- atrophic Neck- flexible , trachea midline, no stridor , thyroid nl, carotid no bruit Chest - symmetrical excursion , unlabored           Heart/CV- RRR , no murmur , no gallop  , no rub, nl s1 s2                           - JVD- none , edema- none, stasis changes- none, varices- none           Lung- Unlabored, clear, wheeze- none, cough- none , dullness-none, rub- none           Chest wall-  Abd-  Br/ Gen/ Rectal- Not done, not indicated Extrem- cyanosis- none, clubbing, none, atrophy- none, strength- nl Neuro- grossly intact to observation  Assessment and Plan    Asthma/COPD Stable on Bevespi after switching from [place] due to concerns about osteoporosis. No current breathing troubles reported. -Trial of Stiolto as an alternative to [place], to assess for any improvement in symptoms.  Osteoporosis Concerns about the impact of inhaled steroids on bone health. Reclast administered last year with reported side effects. Bone scan scheduled later this month to assess current bone health. -Continue current management plan under gynecologist.  Palpitations No current symptoms reported. EKG performed by PCP was normal. -Continue current management plan under PCP.  General Health Maintenance -Continue current vaccination schedule.

## 2022-12-20 ENCOUNTER — Ambulatory Visit: Payer: Medicare HMO | Admitting: Internal Medicine

## 2022-12-20 ENCOUNTER — Encounter: Payer: Self-pay | Admitting: Internal Medicine

## 2022-12-20 VITALS — BP 108/66 | HR 67 | Wt 117.0 lb

## 2022-12-20 DIAGNOSIS — J4489 Other specified chronic obstructive pulmonary disease: Secondary | ICD-10-CM

## 2022-12-20 NOTE — Patient Instructions (Signed)
Order- sample x 2 Stiolto 2.5   inhale 2 puffs daily  Compare this to your Bevespi and see which you want prescribed.

## 2022-12-23 ENCOUNTER — Other Ambulatory Visit: Payer: Self-pay | Admitting: Internal Medicine

## 2022-12-24 NOTE — Telephone Encounter (Signed)
Theophylline refilled ?

## 2023-01-08 DIAGNOSIS — M8588 Other specified disorders of bone density and structure, other site: Secondary | ICD-10-CM | POA: Diagnosis not present

## 2023-01-08 LAB — HM DEXA SCAN

## 2023-01-21 DIAGNOSIS — H02831 Dermatochalasis of right upper eyelid: Secondary | ICD-10-CM | POA: Diagnosis not present

## 2023-01-21 DIAGNOSIS — H43313 Vitreous membranes and strands, bilateral: Secondary | ICD-10-CM | POA: Diagnosis not present

## 2023-01-21 DIAGNOSIS — H43312 Vitreous membranes and strands, left eye: Secondary | ICD-10-CM | POA: Diagnosis not present

## 2023-01-21 DIAGNOSIS — H18413 Arcus senilis, bilateral: Secondary | ICD-10-CM | POA: Diagnosis not present

## 2023-01-21 DIAGNOSIS — Z961 Presence of intraocular lens: Secondary | ICD-10-CM | POA: Diagnosis not present

## 2023-01-29 DIAGNOSIS — H43392 Other vitreous opacities, left eye: Secondary | ICD-10-CM | POA: Diagnosis not present

## 2023-02-04 DIAGNOSIS — M25562 Pain in left knee: Secondary | ICD-10-CM | POA: Diagnosis not present

## 2023-02-14 ENCOUNTER — Encounter: Payer: Self-pay | Admitting: Internal Medicine

## 2023-02-19 ENCOUNTER — Other Ambulatory Visit: Payer: Self-pay

## 2023-02-19 MED ORDER — ALBUTEROL SULFATE HFA 108 (90 BASE) MCG/ACT IN AERS: INHALATION_SPRAY | RESPIRATORY_TRACT | 8 refills | Status: AC

## 2023-02-20 ENCOUNTER — Encounter: Payer: Self-pay | Admitting: Internal Medicine

## 2023-02-21 ENCOUNTER — Other Ambulatory Visit: Payer: Self-pay

## 2023-02-21 MED ORDER — BEVESPI AEROSPHERE 9-4.8 MCG/ACT IN AERO: 2.0000 | INHALATION_SPRAY | Freq: Two times a day (BID) | RESPIRATORY_TRACT | 4 refills | Status: DC

## 2023-03-25 ENCOUNTER — Ambulatory Visit (INDEPENDENT_AMBULATORY_CARE_PROVIDER_SITE_OTHER)

## 2023-03-25 VITALS — BP 110/78 | HR 58 | Ht 62.5 in | Wt 119.2 lb

## 2023-03-25 DIAGNOSIS — Z0000 Encounter for general adult medical examination without abnormal findings: Secondary | ICD-10-CM

## 2023-03-25 NOTE — Patient Instructions (Addendum)
 Ms. Schelling , Thank you for taking time to come for your Medicare Wellness Visit. I appreciate your ongoing commitment to your health goals. Please review the following plan we discussed and let me know if I can assist you in the future.   Referrals/Orders/Follow-Ups/Clinician Recommendations: Aim for 30 minutes of exercise or brisk walking, 6-8 glasses of water, and 5 servings of fruits and vegetables each day.  Repeat Mammogram due in 04/2023 at [facility].    This is a list of the screening recommended for you and due dates:  Health Maintenance  Topic Date Due   COVID-19 Vaccine (8 - 2024-25 season) 09/09/2022   Mammogram  04/09/2023*   Medicare Annual Wellness Visit  03/24/2024   Colon Cancer Screening  07/24/2027   DTaP/Tdap/Td vaccine (3 - Td or Tdap) 02/07/2028   Pneumonia Vaccine  Completed   Flu Shot  Completed   DEXA scan (bone density measurement)  Completed   Hepatitis C Screening  Completed   Zoster (Shingles) Vaccine  Completed   HPV Vaccine  Aged Out  *Topic was postponed. The date shown is not the original due date.    Advanced directives: (Copy Requested) Please bring a copy of your health care power of attorney and living will to the office to be added to your chart at your convenience. You can mail to [facility] [address] [place], [place] [address] or email to [email or web address]  Next Medicare Annual Wellness Visit scheduled for next year: Yes - 2026

## 2023-03-25 NOTE — Progress Notes (Signed)
 Subjective:   Jessica Richards is a 68 y.o. who presents for a Medicare Wellness preventive visit.  Visit Complete: In person  Persons Participating in Visit: Patient.  AWV Questionnaire: Yes: Patient Medicare AWV questionnaire was completed by the patient on (partial) 03/22/2023; I have confirmed that all information answered by patient is correct and no changes since this date.  Cardiac Risk Factors include: advanced age (>34men, >31 women)     Objective:    Today's Vitals   03/25/23 1420  BP: 110/78  Pulse: (!) 58  Weight: 119 lb 3.2 oz (54.1 kg)  Height: 5' 2.5" (1.588 m)   Body mass index is 21.45 kg/m.     03/25/2023    2:18 PM 02/23/2016    6:05 AM 02/15/2016    2:50 PM  Advanced Directives  Does Patient Have a Medical Advance Directive? Yes No No  Type of Estate agent of [place];Living will    Copy of Healthcare Power of Attorney in Chart? No - copy requested    Would patient like information on creating a medical advance directive?   No - Patient declined    Current Medications (verified) Outpatient Encounter Medications as of 03/25/2023  Medication Sig   albuterol (PROVENTIL) (2.5 MG/3ML) 0.083% nebulizer solution INHALE THE CONTENTS OF 1   VIAL VIA NEBULIZER 4 TIMES A DAY AS NEEDED   albuterol (VENTOLIN HFA) 108 (90 Base) MCG/ACT inhaler TAKE 2 PUFFS BY MOUTH EVERY 6 HOURS AS NEEDED FOR WHEEZE OR SHORTNESS OF BREATH   Glycopyrrolate-Formoterol (BEVESPI AEROSPHERE) 9-4.8 MCG/ACT AERO Inhale 2 puffs into the lungs 2 (two) times daily.   theophylline (UNIPHYL) 400 MG 24 hr tablet TAKE 1 TABLET DAILY   No facility-administered encounter medications on file as of 03/25/2023.    Allergies (verified) Other and Pollen extract   History: Past Medical History:  Diagnosis Date   Allergic rhinitis    Anxiety    Arthritis    Asthma    COPD bronchitis    Dyspnea    with exertion   Headache    due to neck psin   Heart murmur    slight  no  cardiology   Mild mitral regurgitation by prior echocardiogram 12/17/06   Osteopenia    DEXA @ gyn 11/909/10: -2.0 DEXA @ gyn 11/20/10: -1.8 DEXA @ gyn 11/20/12: -2.2   Shingles 2012   Past Surgical History:  Procedure Laterality Date   ANTERIOR CERVICAL DECOMP/DISCECTOMY FUSION N/A 02/23/2016   Procedure: ANTERIOR CERVICAL DECOMPRESSION/DISCECTOMY C5-6;  Surgeon: Venita Lick, MD;  Location: [facility] OR;  Service: Orthopedics;  Laterality: N/A;  Requests 2.5 hrs   EYE SURGERY Bilateral    cataract surgery   2014   Family History  Problem Relation Age of Onset   Hypertension Mother        smoker   Coronary artery disease Father        started in 80s   Coronary artery disease Other        female 1 st degree relative < 50-father   Social History   Socioeconomic History   Marital status: Married    Spouse name: Not on file   Number of children: Not on file   Years of education: Not on file   Highest education level: Some college, no degree  Occupational History   Occupation: [employer]  Tobacco Use   Smoking status: Former    Current packs/day: 0.00    Average packs/day: 3.0 packs/day for 15.0 years (45.0  ttl pk-yrs)    Types: Cigarettes    Start date: 01/08/1970    Quit date: 01/08/1985    Years since quitting: 38.2    Passive exposure: Past   Smokeless tobacco: Never  Vaping Use   Vaping status: Never Used  Substance and Sexual Activity   Alcohol use: No    Alcohol/week: 0.0 standard drinks of alcohol   Drug use: No   Sexual activity: Not on file  Other Topics Concern   Not on file  Social History Narrative   Not on file   Social Drivers of Health   Financial Resource Strain: Low Risk  (03/25/2023)   Overall Financial Resource Strain (CARDIA)    Difficulty of Paying Living Expenses: Not hard at all  Food Insecurity: No Food Insecurity (03/25/2023)   Hunger Vital Sign    Worried About Running Out of Food in the Last Year: Never true    Ran Out of Food in the Last Year: Never  true  Transportation Needs: No Transportation Needs (03/25/2023)   PRAPARE - Administrator, Civil Service (Medical): No    Lack of Transportation (Non-Medical): No  Physical Activity: Sufficiently Active (03/25/2023)   Exercise Vital Sign    Days of Exercise per Week: 6 days    Minutes of Exercise per Session: 90 min  Stress: No Stress Concern Present (03/25/2023)   [employer] of Occupational Health - Occupational Stress Questionnaire    Feeling of Stress : Only a little  Social Connections: Socially Integrated (03/25/2023)   Social Connection and Isolation Panel [NHANES]    Frequency of Communication with Friends and Family: More than three times a week    Frequency of Social Gatherings with Friends and Family: Once a week    Attends Religious Services: More than 4 times per year    Active Member of [employer] or Organizations: Yes    Attends Engineer, structural: More than 4 times per year    Marital Status: Married    Tobacco Counseling Counseling given: No    Clinical Intake:  Pre-visit preparation completed: Yes  Pain : No/denies pain     BMI - recorded: 21.45 Nutritional Status: BMI of 19-24  Normal Nutritional Risks: None  How often do you need to have someone help you when you read instructions, pamphlets, or other written materials from your doctor or pharmacy?: 1 - Never  Interpreter Needed?: No  Information entered by :: Hassell Halim, CMA   Activities of Daily Living     03/25/2023    2:22 PM  In your present state of health, do you have any difficulty performing the following activities:  Hearing? 0  Vision? 0  Difficulty concentrating or making decisions? 0  Walking or climbing stairs? 0  Dressing or bathing? 0  Doing errands, shopping? 0  Preparing Food and eating ? N  Using the Toilet? N  In the past six months, have you accidently leaked urine? N  Do you have problems with loss of bowel control? N  Managing your  Medications? N  Managing your Finances? N  Housekeeping or managing your Housekeeping? N    Patient Care Team: Myrlene Broker, MD as PCP - General (Internal Medicine) Marcelle Overlie, MD as Consulting Physician (Obstetrics and Gynecology)  Indicate any recent Medical Services you may have received from other than [facility] providers in the past year (date may be approximate).     Assessment:   This is a routine wellness examination for  Jessica Richards.  Hearing/Vision screen Hearing Screening - Comments:: Denies hearing difficulties   Vision Screening - Comments:: Wears rx glasses - up to date with routine eye exams with Dr Hyacinth Meeker   Goals Addressed               This Visit's Progress     Weight (lb) < 200 lb (90.7 kg) (pt-stated)   119 lb 3.2 oz (54.1 kg)     Patient stated would like to lose weight (about 3lbs).         Depression Screen     03/25/2023    2:28 PM 04/09/2022    9:03 AM 03/07/2022    2:16 PM 04/05/2021    9:10 AM 02/16/2020    1:01 PM 02/12/2019    1:52 PM 02/06/2018    1:03 PM  PHQ 2/9 Scores  PHQ - 2 Score 0 0 0 0 6 0 0  PHQ- 9 Score 0 2   13      Fall Risk     03/25/2023    2:23 PM 04/09/2022    9:03 AM 03/07/2022    2:16 PM 04/05/2021    9:10 AM 11/23/2019    1:45 PM  Fall Risk   Falls in the past year? 0 0 0 0 0  Number falls in past yr: 0 0 0 0 0  Injury with Fall? 0 0 0 0 0  Risk for fall due to : No Fall Risks  No Fall Risks    Follow up Falls prevention discussed;Falls evaluation completed Falls evaluation completed Falls evaluation completed      MEDICARE RISK AT HOME:  Medicare Risk at Home Any stairs in or around the home?: Yes If so, are there any without handrails?: No Home free of loose throw rugs in walkways, pet beds, electrical cords, etc?: Yes Adequate lighting in your home to reduce risk of falls?: Yes Life alert?: No Use of a cane, walker or w/c?: No Grab bars in the bathroom?: No Shower chair or bench in shower?: No Elevated  toilet seat or a handicapped toilet?: No  TIMED UP AND GO:  Was the test performed?  No  Cognitive Function: 6CIT completed        03/25/2023    2:24 PM  6CIT Screen  What Year? 0 points  What month? 0 points  What time? 0 points  Count back from 20 0 points  Months in reverse 0 points  Repeat phrase 0 points  Total Score 0 points    Immunizations Immunization History  Administered Date(s) Administered   Fluad Quad(high Dose 65+) 01/07/2019, 10/18/2021   Influenza Split 11/09/2011, 11/17/2012, 09/08/2013, 11/09/2015, 11/08/2016   Influenza, Seasonal, Injecte, Preservative Fre 10/18/2020   Influenza,inj,Quad PF,6+ Mos 11/06/2014, 11/08/2015, 10/14/2017, 10/18/2018   Influenza,inj,quad, With Preservative 11/28/2016   Influenza-Unspecified 11/09/2014, 10/14/2017, 11/13/2018, 10/13/2019, 10/18/2020, 10/09/2022   Moderna Covid-19 Fall Seasonal Vaccine 58yrs & older 10/18/2021   Moderna Covid-19 Vaccine Bivalent Booster 76yrs & up 06/15/2021   Moderna Sars-Covid-2 Vaccination 03/19/2019, 04/17/2019, 11/30/2019, 04/15/2020   PNEUMOCOCCAL CONJUGATE-20 04/05/2021   Pneumococcal Polysaccharide-23 01/16/2013   Respiratory Syncytial Virus Vaccine,Recomb Aduvanted(Arexvy) 10/18/2021   Tdap 07/21/2007, 02/06/2018   Unspecified SARS-COV-2 Vaccination 10/18/2020   Zoster Recombinant(Shingrix) 02/06/2018, 06/23/2018   Zoster, Live 02/06/2018    Screening Tests Health Maintenance  Topic Date Due   COVID-19 Vaccine (8 - 2024-25 season) 09/09/2022   MAMMOGRAM  04/09/2023 (Originally 07/22/2021)   Medicare Annual Wellness (AWV)  03/24/2024   Colonoscopy  07/24/2027   DTaP/Tdap/Td (3 - Td or Tdap) 02/07/2028   Pneumonia Vaccine 75+ Years old  Completed   INFLUENZA VACCINE  Completed   DEXA SCAN  Completed   Hepatitis C Screening  Completed   Zoster Vaccines- Shingrix  Completed   HPV VACCINES  Aged Out    Health Maintenance  Health Maintenance Due  Topic Date Due   COVID-19  Vaccine (8 - 2024-25 season) 09/09/2022   Health Maintenance Items Addressed: 03/25/2023  Mammogram status: Completed.  Pt stated she is scheduled to get repeat Mammogram in 04/2023 w/[facility] (Dr Marcelle Overlie).  Previous reports (MMG, DEXA, and Pap Smear reports has been requested).  Additional Screening:  Vision Screening: Recommended annual ophthalmology exams for early detection of glaucoma and other disorders of the eye.  Dental Screening: Recommended annual dental exams for proper oral hygiene  Community Resource Referral / Chronic Care Management: CRR required this visit?  No   CCM required this visit?  No     Plan:     I have personally reviewed and noted the following in the patient's chart:   Medical and social history Use of alcohol, tobacco or illicit drugs  Current medications and supplements including opioid prescriptions. Patient is not currently taking opioid prescriptions. Functional ability and status Nutritional status Physical activity Advanced directives List of other physicians Hospitalizations, surgeries, and ER visits in previous 12 months Vitals Screenings to include cognitive, depression, and falls Referrals and appointments  In addition, I have reviewed and discussed with patient certain preventive protocols, quality metrics, and best practice recommendations. A written personalized care plan for preventive services as well as general preventive health recommendations were provided to patient.     Darreld Mclean, CMA   03/25/2023   After Visit Summary: (MyChart) Due to this being a telephonic visit, the after visit summary with patients personalized plan was offered to patient via MyChart   Notes: Nothing significant to report at this time.

## 2023-04-16 ENCOUNTER — Ambulatory Visit (INDEPENDENT_AMBULATORY_CARE_PROVIDER_SITE_OTHER): Payer: Medicare HMO | Admitting: Internal Medicine

## 2023-04-16 ENCOUNTER — Encounter: Payer: Self-pay | Admitting: Internal Medicine

## 2023-04-16 VITALS — BP 120/80 | HR 64 | Temp 97.8°F | Ht 63.0 in | Wt 119.0 lb

## 2023-04-16 DIAGNOSIS — Z13220 Encounter for screening for lipoid disorders: Secondary | ICD-10-CM | POA: Diagnosis not present

## 2023-04-16 DIAGNOSIS — M8589 Other specified disorders of bone density and structure, multiple sites: Secondary | ICD-10-CM

## 2023-04-16 DIAGNOSIS — Z1212 Encounter for screening for malignant neoplasm of rectum: Secondary | ICD-10-CM

## 2023-04-16 DIAGNOSIS — Z1211 Encounter for screening for malignant neoplasm of colon: Secondary | ICD-10-CM

## 2023-04-16 DIAGNOSIS — Z0000 Encounter for general adult medical examination without abnormal findings: Secondary | ICD-10-CM | POA: Diagnosis not present

## 2023-04-16 DIAGNOSIS — J4540 Moderate persistent asthma, uncomplicated: Secondary | ICD-10-CM

## 2023-04-16 LAB — COMPREHENSIVE METABOLIC PANEL WITH GFR
ALT: 17 U/L (ref 0–35)
AST: 23 U/L (ref 0–37)
Albumin: 4.5 g/dL (ref 3.5–5.2)
Alkaline Phosphatase: 61 U/L (ref 39–117)
BUN: 19 mg/dL (ref 6–23)
CO2: 28 meq/L (ref 19–32)
Calcium: 9.8 mg/dL (ref 8.4–10.5)
Chloride: 105 meq/L (ref 96–112)
Creatinine, Ser: 0.78 mg/dL (ref 0.40–1.20)
GFR: 78.59 mL/min (ref >60.00)
Glucose, Bld: 95 mg/dL (ref 70–99)
Potassium: 4.2 meq/L (ref 3.5–5.1)
Sodium: 140 meq/L (ref 135–145)
Total Bilirubin: 0.4 mg/dL (ref 0.2–1.2)
Total Protein: 6.7 g/dL (ref 6.0–8.3)

## 2023-04-16 LAB — VITAMIN D 25 HYDROXY (VIT D DEFICIENCY, FRACTURES): VITD: 51.79 ng/mL (ref 30.00–100.00)

## 2023-04-16 LAB — CBC
HCT: 39.8 % (ref 36.0–46.0)
Hemoglobin: 12.8 g/dL (ref 12.0–15.0)
MCHC: 32.3 g/dL (ref 30.0–36.0)
MCV: 80 fl (ref 78.0–100.0)
Platelets: 255 10*3/uL (ref 150.0–400.0)
RBC: 4.97 Mil/uL (ref 3.87–5.11)
RDW: 14.1 % (ref 11.5–15.5)
WBC: 4.9 10*3/uL (ref 4.0–10.5)

## 2023-04-16 LAB — LIPID PANEL
Cholesterol: 179 mg/dL (ref 0–200)
HDL: 84.2 mg/dL (ref >39.00)
LDL Cholesterol: 82 mg/dL (ref 0–99)
NonHDL: 94.88
Total CHOL/HDL Ratio: 2
Triglycerides: 63 mg/dL (ref 0.0–149.0)
VLDL: 12.6 mg/dL (ref 0.0–40.0)

## 2023-04-16 LAB — TSH: TSH: 1.1 u[IU]/mL (ref 0.35–5.50)

## 2023-04-16 NOTE — Assessment & Plan Note (Signed)
 Reviewed recent DEXA Dec 2024 which has stable osteopenia. Will continue to monitor.

## 2023-04-16 NOTE — Progress Notes (Unsigned)
   Subjective:   Patient ID: Jessica Richards, female    DOB: 11/19/1955, 68 y.o.   MRN: [id number]  HPI The patient is here for physical.  PMH, [facility], social history reviewed and updated  Review of Systems  Objective:  Physical Exam  There were no vitals filed for this visit.  Assessment & Plan:

## 2023-04-16 NOTE — Patient Instructions (Signed)
 Think about getting an MMR booster at the pharmacy.

## 2023-04-19 NOTE — Assessment & Plan Note (Signed)
 Seeing pulmonary and stable on bevespit and theophylline.

## 2023-04-19 NOTE — Assessment & Plan Note (Signed)
Flu shot yearly. Pneumonia complete. Shingrix complete. Tetanus up to date. Colonoscopy up to date. Mammogram up to date, pap smear aged out and dexa up to date. Counseled about sun safety and mole surveillance. Counseled about the dangers of distracted driving. Given 10 year screening recommendations.   

## 2023-04-23 ENCOUNTER — Encounter: Payer: Self-pay | Admitting: Internal Medicine

## 2023-05-06 DIAGNOSIS — M25562 Pain in left knee: Secondary | ICD-10-CM | POA: Diagnosis not present

## 2023-05-21 ENCOUNTER — Other Ambulatory Visit: Payer: Self-pay | Admitting: Internal Medicine

## 2023-07-24 ENCOUNTER — Encounter: Payer: Self-pay | Admitting: Internal Medicine

## 2023-07-25 ENCOUNTER — Encounter: Payer: Self-pay | Admitting: Internal Medicine

## 2023-07-25 NOTE — Telephone Encounter (Signed)
 Order has been placed.

## 2023-07-25 NOTE — Addendum Note (Signed)
 Addended by: ROSALVA LEX RAMAN on: 07/25/2023 10:31 AM   Modules accepted: Orders

## 2023-07-26 ENCOUNTER — Telehealth: Payer: Self-pay

## 2023-07-26 MED ORDER — THEOPHYLLINE ER 400 MG PO TB24: 400.0000 mg | ORAL_TABLET | Freq: Every day | ORAL | 2 refills | Status: DC

## 2023-07-26 NOTE — Telephone Encounter (Signed)
 RX sent

## 2023-09-03 ENCOUNTER — Encounter: Payer: Self-pay | Admitting: Internal Medicine

## 2023-09-04 NOTE — Telephone Encounter (Signed)
**Note De-identified  [place] Obfuscation** Please advise 

## 2023-09-05 NOTE — Telephone Encounter (Signed)
 Nobody is able to find this. Ask your drug store if they can get a related product such as Theo-24 or Uniphyll

## 2023-09-05 NOTE — Telephone Encounter (Signed)
 You should be getting a fax to show what they have in stock from pharmacy

## 2023-09-06 ENCOUNTER — Telehealth: Payer: Self-pay

## 2023-09-06 ENCOUNTER — Other Ambulatory Visit: Payer: Self-pay | Admitting: Acute Care

## 2023-09-06 ENCOUNTER — Ambulatory Visit: Payer: Self-pay | Admitting: Internal Medicine

## 2023-09-06 DIAGNOSIS — J45909 Unspecified asthma, uncomplicated: Secondary | ICD-10-CM

## 2023-09-06 MED ORDER — THEOPHYLLINE ER 300 MG PO CP24: ORAL_CAPSULE | ORAL | 0 refills | Status: DC

## 2023-09-06 MED ORDER — THEOPHYLLINE ER 300 MG PO CP24: ORAL_CAPSULE | ORAL | 12 refills | Status: DC

## 2023-09-06 NOTE — Telephone Encounter (Signed)
 I have sent script for theophylline  300 mg ER. The [facility] in [facility] has 300 mg Theophylline  ER, not the 400 mg she was taking. This is a capsule, I think, so it can't be split. She should try it and see if it works for her.  Also the computer indicates it is not on her insurance formulary and she would need us  to request a formulary exception to get it covered. We can start that process, because she finds theophylline  really works well for her, and almost no drug stores have it now. Meanwhile, it may be worth it to her to pay out of pocket.

## 2023-09-06 NOTE — Telephone Encounter (Signed)
 Copied from CRM [id number]. Topic: Clinical - Prescription Issue >> Sep 06, 2023 12:54 PM Celestine F wrote: Reason for CRM: Pt is having issues with the previous encounter from 09/05/2023 regarding obtaining a refill for Theophylline  400 mg ER. She stated that [facility] was faxing over their in stock options to Dr. Neysa on 09/05/2023. Pt stated she is out of her medication now. Please call the patient back at [phone] ok to leave a vm.

## 2023-09-06 NOTE — Progress Notes (Signed)
 Received a call from the on call line. Patient prescription was placed for theophylline  300 mg ER capsules. Pt. Insurance will only cover tablets. I have updated the order and called the pharmacy with the prescription clarification. It is also in the notes. Pt is aware of the change, as I spent several minutes on the phone with her.

## 2023-09-06 NOTE — Telephone Encounter (Signed)
 FYI Only or Action Required?: Action required by provider: prescription adjustment due to insurance.  Patient is followed in Pulmonology for asthma, last seen on 12/20/2022 by Neysa Reggy BIRCH, MD.  Called Nurse Triage reporting Medication Refill.   Triage Disposition: Call PCP Now  Patient/caregiver understands and will follow disposition?: Yes  On call provider called who took over the call to assist the patient.          Patient calling due to her insurance not covering Theophylline  400 mg ER in capsule form, but would cover it in tablet.           Reason for Disposition  [1] Caller has URGENT medicine question about med that primary care doctor (or NP/PA) or specialist prescribed AND [2] triager unable to answer question  Answer Assessment - Initial Assessment Questions 1. NAME of MEDICINE: What medicine(s) are you calling about?     Theophylline  400 mg ER 2. QUESTION: What is your question? (e.g., double dose of medicine, side effect)     Could it be changed from capsule to tablet per her insurance  3. PRESCRIBER: Who prescribed the medicine? Reason: if prescribed by specialist, call should be referred to that group.     Dr. Neysa  Protocols used: Medication Question Call-A-AH

## 2023-09-06 NOTE — Telephone Encounter (Signed)
 Copied from CRM [id number]. Topic: Clinical - Prescription Issue >> Sep 06, 2023 12:54 PM Celestine F wrote: Reason for CRM: Pt is having issues with the previous encounter from 09/05/2023 regarding obtaining a refill for Theophylline  400 mg ER. She stated that [facility] was faxing over their in stock options to Dr. Neysa on 09/05/2023. Pt stated she is out of her medication now. Please call the patient back at [phone] ok to leave a vm.  Spoke with patient regarding prior message . [facility] Dr.Young [facility] . Patient's voice was understanding. Nothing else further needed.

## 2023-09-10 NOTE — Telephone Encounter (Signed)
 Handled on 10/07/2023 by Lauraine Lites, NP please see previous encounter

## 2023-09-11 NOTE — Telephone Encounter (Signed)
 FYI

## 2023-09-19 ENCOUNTER — Ambulatory Visit: Payer: Self-pay | Admitting: Adult Health

## 2023-09-19 ENCOUNTER — Ambulatory Visit (INDEPENDENT_AMBULATORY_CARE_PROVIDER_SITE_OTHER): Admitting: Adult Health

## 2023-09-19 ENCOUNTER — Ambulatory Visit (INDEPENDENT_AMBULATORY_CARE_PROVIDER_SITE_OTHER)

## 2023-09-19 ENCOUNTER — Encounter: Payer: Self-pay | Admitting: Adult Health

## 2023-09-19 VITALS — BP 124/68 | HR 55 | Temp 97.8°F | Ht 62.5 in | Wt 118.8 lb

## 2023-09-19 DIAGNOSIS — J4540 Moderate persistent asthma, uncomplicated: Secondary | ICD-10-CM | POA: Diagnosis not present

## 2023-09-19 DIAGNOSIS — J310 Chronic rhinitis: Secondary | ICD-10-CM

## 2023-09-19 DIAGNOSIS — J4489 Other specified chronic obstructive pulmonary disease: Secondary | ICD-10-CM

## 2023-09-19 DIAGNOSIS — R0609 Other forms of dyspnea: Secondary | ICD-10-CM | POA: Diagnosis not present

## 2023-09-19 DIAGNOSIS — J45901 Unspecified asthma with (acute) exacerbation: Secondary | ICD-10-CM | POA: Diagnosis not present

## 2023-09-19 DIAGNOSIS — M8589 Other specified disorders of bone density and structure, multiple sites: Secondary | ICD-10-CM | POA: Diagnosis not present

## 2023-09-19 LAB — CBC WITH DIFFERENTIAL/PLATELET
Basophils Absolute: 0.1 K/uL (ref 0.0–0.1)
Basophils Relative: 1.3 % (ref 0.0–3.0)
Eosinophils Absolute: 0.2 K/uL (ref 0.0–0.7)
Eosinophils Relative: 3.9 % (ref 0.0–5.0)
HCT: 41.4 % (ref 36.0–46.0)
Hemoglobin: 13.1 g/dL (ref 12.0–15.0)
Lymphocytes Relative: 36.3 % (ref 12.0–46.0)
Lymphs Abs: 1.7 K/uL (ref 0.7–4.0)
MCHC: 31.6 g/dL (ref 30.0–36.0)
MCV: 79.6 fl (ref 78.0–100.0)
Monocytes Absolute: 0.5 K/uL (ref 0.1–1.0)
Monocytes Relative: 10.8 % (ref 3.0–12.0)
Neutro Abs: 2.2 K/uL (ref 1.4–7.7)
Neutrophils Relative %: 47.7 % (ref 43.0–77.0)
Platelets: 251 K/uL (ref 150.0–400.0)
RBC: 5.19 Mil/uL — ABNORMAL HIGH (ref 3.87–5.11)
RDW: 13.7 % (ref 11.5–15.5)
WBC: 4.7 K/uL (ref 4.0–10.5)

## 2023-09-19 LAB — BRAIN NATRIURETIC PEPTIDE: Pro B Natriuretic peptide (BNP): 94 pg/mL (ref 0.0–100.0)

## 2023-09-19 MED ORDER — BREZTRI AEROSPHERE 160-9-4.8 MCG/ACT IN AERO: 2.0000 | INHALATION_SPRAY | Freq: Two times a day (BID) | RESPIRATORY_TRACT | 3 refills | Status: DC

## 2023-09-19 MED ORDER — BREZTRI AEROSPHERE 160-9-4.8 MCG/ACT IN AERO: 2.0000 | INHALATION_SPRAY | Freq: Two times a day (BID) | RESPIRATORY_TRACT | Status: DC

## 2023-09-19 NOTE — Progress Notes (Signed)
 @Patient  ID: Jessica Richards, female    DOB: 06-30-1955, 68 y.o.   MRN: [id number]  Chief Complaint  Patient presents with   Acute Visit    Having more shortness of breath, using albuterol  more,medication issues     Referring provider: Rollene Almarie LABOR, *  HPI: 68 yo female former smoker followed for COPD with Asthma and Allergic Rhinitis   TEST/EVENTS :  01/22/11-99%, 99%, 97%, 456 meters Not limited by oxygenation -CY PFT 01/22/2011-mild obstructive airways disease a slight response to bronchodilator, air trapping, normal diffusion. FEV1 2.18/95%, FEV1/FVC 0.65, FEF 25-75% 1.19/44%. RV 120%, DLCO 91%   09/19/2023 Follow up: COPD with Asthma and Allergic Rhinitis  Discussed the use of AI scribe software for clinical note transcription with the patient, who gave verbal consent to proceed.  History of Present Illness Jessica Richards is a 68 year old female with COPD and asthma who presents for medication refills and follow-up.  She has a long-standing history of COPD and asthma, managed since she was 68 years old. She requested medication refills, specifically theophylline , which she has been on for many years. She has difficulty obtaining theophylline  due to it being on back order which she feels is less effective than her usual 400 mg dose. There is increased reliance on her rescue inhaler since the dose change.  Her symptoms are described as 'tight' rather than wheezing, particularly if she misses a dose of theophylline . She is active, walking and biking regularly, but reports increased shortness of breath since the dose change. She quit smoking at age 6.  Current medications include theophylline  300 mg, Bevespi , and a rescue inhaler. She has previously been on Breztri , which she preferred, but was discontinued due to concerns about osteoporosis. There is a history of using prednisone  and other steroids extensively in the past, which she believes contributed to her  osteoporosis.  Spirometry today shows significant decline in lung function with FEV1 at 55%, ratio 56, FVC 75%.  She reports allergy  symptoms currently managed with generic Claritin. There is a history of severe asthma attacks, including a hospitalization for status asthmaticus years ago. No chest pain, jaw pain, arm pain, or sweating, but there is an increased heart rate with rescue inhaler use. She has not experienced wheezing in the past five years but is concerned about potential asthma attacks due to current tightness.  Family history includes heart disease on her father's side,She maintains a vegetarian/vegan diet and is very active, engaging in regular walking and biking.     Allergies  Allergen Reactions   Other Other (See Comments)    Other reaction(s): Wheezing Pet dander   Pollen Extract     Other reaction(s): Other    Immunization History  Administered Date(s) Administered   Fluad Quad(high Dose 65+) 01/07/2019, 10/18/2021   Influenza Split 11/09/2011, 11/17/2012, 09/08/2013, 11/09/2015, 11/08/2016   Influenza, Seasonal, Injecte, Preservative Fre 10/18/2020   Influenza,inj,Quad PF,6+ Mos 11/06/2014, 11/08/2015, 10/14/2017, 10/18/2018   Influenza,inj,quad, With Preservative 11/28/2016   Influenza-Unspecified 11/09/2014, 10/14/2017, 11/13/2018, 10/13/2019, 10/18/2020, 10/09/2022   Moderna Covid-19 Fall Seasonal Vaccine 85yrs & older 10/18/2021   Moderna Covid-19 Vaccine Bivalent Booster 32yrs & up 06/15/2021   Moderna Sars-Covid-2 Vaccination 03/19/2019, 04/17/2019, 11/30/2019, 04/15/2020   PNEUMOCOCCAL CONJUGATE-20 04/05/2021   Pneumococcal Polysaccharide-23 01/16/2013   Respiratory Syncytial Virus Vaccine,Recomb Aduvanted(Arexvy) 10/18/2021   Tdap 07/21/2007, 02/06/2018   Unspecified SARS-COV-2 Vaccination 10/18/2020   Zoster Recombinant(Shingrix ) 02/06/2018, 06/23/2018   Zoster, Live 02/06/2018    Past Medical History:  Diagnosis Date   Allergic rhinitis     Allergy  age 34   Anxiety    Arthritis    Asthma    COPD bronchitis    Dyspnea    with exertion   Headache    due to neck psin   Heart murmur    slight  no cardiology   Mild mitral regurgitation by prior echocardiogram 12/17/2006   Osteopenia    DEXA @ gyn 11/909/10: -2.0 DEXA @ gyn 11/20/10: -1.8 DEXA @ gyn 11/20/12: -2.2   Osteoporosis    Shingles 2012    Tobacco History: Social History   Tobacco Use  Smoking Status Former   Current packs/day: 0.00   Average packs/day: 3.0 packs/day for 15.0 years (45.0 ttl pk-yrs)   Types: Cigarettes   Start date: 01/08/1970   Quit date: 01/08/1985   Years since quitting: 38.7   Passive exposure: Past  Smokeless Tobacco Never   Counseling given: Not Answered   Outpatient Medications Prior to Visit  Medication Sig Dispense Refill   albuterol  (PROVENTIL ) (2.5 MG/3ML) 0.083% nebulizer solution INHALE THE CONTENTS OF 1 VIAL VIA NEBULIZER 4 TIMES A DAY AS NEEDED 150 mL 11   albuterol  (VENTOLIN  HFA) 108 (90 Base) MCG/ACT inhaler TAKE 2 PUFFS BY MOUTH EVERY 6 HOURS AS NEEDED FOR WHEEZE OR SHORTNESS OF BREATH 18 each 8   theophylline  (THEO-24) 300 MG 24 hr capsule 1 daily for asthma 30 capsule 12   Glycopyrrolate -Formoterol  (BEVESPI  AEROSPHERE) 9-4.8 MCG/ACT AERO Inhale 2 puffs into the lungs 2 (two) times daily. 32.1 g 4   theophylline  (THEO-24) 300 MG 24 hr capsule Pt. Needs theophylline  300 mg ER tablets 30 capsule 0   No facility-administered medications prior to visit.     Review of Systems:   Constitutional:   No  weight loss, night sweats,  Fevers, chills, fatigue, or  lassitude.  HEENT:   No headaches,  Difficulty swallowing,  Tooth/dental problems, or  Sore throat,                No sneezing, itching, ear ache, +nasal congestion, post nasal drip,   CV:  No chest pain,  Orthopnea, PND, swelling in lower extremities, anasarca, dizziness, palpitations, syncope.   GI  No heartburn, indigestion, abdominal pain, nausea, vomiting,  diarrhea, change in bowel habits, loss of appetite, bloody stools.   Resp: .  No chest wall deformity  Skin: no rash or lesions.  GU: no dysuria, change in color of urine, no urgency or frequency.  No flank pain, no hematuria   MS:  No joint pain or swelling.  No decreased range of motion.  No back pain.    Physical Exam  BP 124/68   Pulse (!) 55   Temp 97.8 F (36.6 C) (Oral)   Ht 5' 2.5 (1.588 m)   Wt 118 lb 12.8 oz (53.9 kg)   SpO2 99%   BMI 21.38 kg/m   GEN: A/Ox3; pleasant , NAD, well nourished    HEENT:  [place]/AT,  NOSE-clear, THROAT-clear, no lesions, no postnasal drip or exudate noted.   NECK:  Supple w/ fair ROM; no JVD; normal carotid impulses w/o bruits; no thyromegaly or nodules palpated; no lymphadenopathy.    RESP few trace expiratory wheezes on forced expiration  no accessory muscle use, no dullness to percussion  CARD:  RRR, no m/r/g, no peripheral edema, pulses intact, no cyanosis or clubbing.  GI:   Soft & nt; nml bowel sounds; no organomegaly or masses detected.  Musco: Warm bil, no deformities or joint swelling noted.   Neuro: alert, no focal deficits noted.    Skin: Warm, no lesions or rashes    Lab Results:  CBC    BNP No results found for: BNP  ProBNP No results found for: PROBNP  Imaging: DG Chest 2 View Result Date: 09/19/2023 CLINICAL DATA:  Asthma flare up. EXAM: CHEST - 2 VIEW COMPARISON:  December 27, 2014. FINDINGS: The heart size and mediastinal contours are within normal limits. Hyperinflation of the lungs is again noted. Both lungs are clear. The visualized skeletal structures are unremarkable. IMPRESSION: No active cardiopulmonary disease. Hyperinflation of the lungs. Electronically Signed   By: Lynwood Landy Raddle M.D.   On: 09/19/2023 10:30    Administration History     None           No data to display          No results found for: NITRICOXIDE      Assessment & Plan:   No problem-specific  Assessment & Plan notes found for this encounter.  Assessment and Plan Assessment & Plan Asthma and Chronic Obstructive Pulmonary Disease (COPD) overlap syndrome   She has long-standing asthma and COPD overlap syndrome, previously managed with theophylline  and Bevespi . Current symptoms include chest tightness and increased reliance on a rescue inhaler since reducing theophylline  from 400 mg to 300 mg. There is no recent wheezing, but she has a history of severe asthma attacks requiring hospitalization. Breztri  was effective but discontinued due to osteoporosis concerns. Plan to transition from t Bevespi  to Breztri , The importance of inhaled corticosteroids for asthma management and potential osteoporosis management through lifestyle and medication was discussed. Prioritizing asthma control Potential alternative asthma treatments were discussed if symptoms persist after discontinuing theophylline . Discontinue Bevespi  and initiate Breztri , two puffs twice daily, with instructions to rinse after use. Send Breztri  prescription to [facility] home delivery with a three-month supply and one-year refill. Provide a sample of Breztri . Continue current theophylline  300 mg until follow-up with Dr. Neysa in December, with a plan to wean off theophylline  chest x-ray today is clear and full  pulmonary function test  on return  Dyspnea   She experiences increased dyspnea since the reduction in theophylline  dosage, with symptoms including exertional dyspnea during activities such as walking and biking. There is no chest pain, jaw pain, or other cardiac symptoms. The differential diagnosis includes asthma exacerbation versus other potential causes. Plan to optimize asthma management and rule out other causes of dyspnea. Use albuterol  nebulizer once daily for the next few days. Order blood work including allergy  panel and heart failure marker to rule out other causes of dyspnea.  Allergic rhinitis   Current symptoms include  sinus congestion and drainage, managed with generic Claritin. The role of allergies in exacerbating asthma symptoms and the importance of managing allergic rhinitis to improve overall respiratory health were discussed. Continue generic Claritin and add Flonase to the allergy  management regimen.  Osteoporosis continue follow-up with primary care continue with activity and strength training  Plan  Patient Instructions  Chest xray today Spirometry today  Stop Bevespi - Begin Breztri  2 puffs Twice daily, rinse after use.  Continue on Theophylline  daily for now  Albuterol  inhaler or neb As needed   Continue on Claritin daily.  Add Flonase 2 puffs daily  Follow up with Dr. Neysa  as planned in December and As needed   Please contact office for sooner follow up if symptoms do not improve or worsen or  seek emergency care             Madelin Stank, NP 09/19/2023

## 2023-09-19 NOTE — Patient Instructions (Addendum)
 Chest xray today Spirometry today  Stop Bevespi - Begin Breztri  2 puffs Twice daily, rinse after use.  Continue on Theophylline  daily for now  Albuterol  inhaler or neb As needed   Continue on Claritin daily.  Add Flonase 2 puffs daily  Follow up with Dr. Neysa  as planned in December and As needed   Please contact office for sooner follow up if symptoms do not improve or worsen or seek emergency care

## 2023-09-24 LAB — RESPIRATORY ALLERGY PANEL REGION II W/ RFLX: ~~LOC~~
Allergen, A. alternata, m6: <0.1 kU/L
Allergen, Cedar tree, t12: <0.1 kU/L
Allergen, Comm Silver Birch, t9: <0.1 kU/L
Allergen, Cottonwood, t14: <0.1 kU/L
Allergen, D pternoyssinus,d7: 1.44 kU/L — ABNORMAL HIGH
Allergen, Mouse Urine Protein, e78: 0.16 kU/L — ABNORMAL HIGH
Allergen, Mulberry, t76: <0.1 kU/L
Allergen, Oak,t7: <0.1 kU/L
Allergen, P. notatum, m1: <0.1 kU/L
Aspergillus fumigatus, m3: <0.1 kU/L
Bermuda Grass: <0.1 kU/L
Box Elder IgE: <0.1 kU/L
CLADOSPORIUM HERBARUM (M2) IGE: <0.1 kU/L
COMMON RAGWEED (SHORT) (W1) IGE: 0.41 kU/L — ABNORMAL HIGH
Cat Dander: 38.2 kU/L — ABNORMAL HIGH
Class: 0
Class: 0
Class: 0
Class: 0
Class: 0
Class: 0
Class: 0
Class: 0
Class: 0
Class: 0
Class: 0
Class: 0
Class: 0
Class: 0
Class: 0
Class: 0
Class: 1
Class: 2
Class: 2
Class: 2
Class: 3
Class: 4
Cockroach: <0.1 kU/L
D. farinae: 1.21 kU/L — ABNORMAL HIGH
Dog Dander: 3.75 kU/L — ABNORMAL HIGH
Elm IgE: <0.1 kU/L
IgE (Immunoglobulin E), Serum: 127 kU/L — ABNORMAL HIGH (ref <=114)
Johnson Grass: 0.2 kU/L — ABNORMAL HIGH
Pecan/Hickory Tree IgE: <0.1 kU/L
Rough Pigweed  IgE: <0.1 kU/L
Sheep Sorrel IgE: <0.1 kU/L
Timothy Grass: 1.03 kU/L — ABNORMAL HIGH

## 2023-09-24 LAB — DOG DANDER COMPONENT
Can f 4(e229) IgE: <0.1 kU/L (ref <0.10)
Can f 6(e230) IgE: 0.9 kU/L — ABNORMAL HIGH (ref <0.10)
E101-IgE Can f 1: 1.3 kU/L — ABNORMAL HIGH (ref <0.10)
E102-IgE Can f 2: <0.1 kU/L (ref <0.10)
E221-IgE Can f 3: 0.95 kU/L — ABNORMAL HIGH (ref <0.10)
E226-IgE Can f 5: <0.1 kU/L (ref <0.10)

## 2023-09-24 LAB — CAT DANDER COMPONENT
E220-IgE Fel d 2: 1.46 kU/L — ABNORMAL HIGH (ref <0.10)
E228-IgE Fel d 4: 6.62 kU/L — ABNORMAL HIGH (ref <0.10)
Fel d 1 (e94) IgE: 23.3 kU/L — ABNORMAL HIGH (ref <0.10)
Fel d 7 (e231) IgE: 5.2 kU/L — ABNORMAL HIGH (ref <0.10)

## 2023-09-24 LAB — INTERPRETATION:

## 2023-09-26 ENCOUNTER — Telehealth: Payer: Self-pay

## 2023-09-26 NOTE — Telephone Encounter (Unsigned)
 Copied from CRM [id number]. Topic: Clinical - Medication Prior Auth >> Sep 24, 2023  4:05 PM Joesph PARAS wrote: Reason for CRM: Patient is calling to state that [facility] texted her Sunday to state that they were trying to reach the office for further information about Breztri  but that they couldn't get a response. Patient requesting an update on this.  PAS inferencing a Prior Authorization will be needed for Breztri  before pharmacy can fill and ship. Alternatively, pharmacy may be confused about which inhaler she is on, Please update patient.

## 2023-09-27 NOTE — Telephone Encounter (Signed)
 Patient has been notified via Phone call and Mychart   -NFN

## 2023-09-30 ENCOUNTER — Telehealth: Payer: Self-pay

## 2023-09-30 DIAGNOSIS — Z1231 Encounter for screening mammogram for malignant neoplasm of breast: Secondary | ICD-10-CM | POA: Diagnosis not present

## 2023-09-30 DIAGNOSIS — Z6821 Body mass index (BMI) 21.0-21.9, adult: Secondary | ICD-10-CM | POA: Diagnosis not present

## 2023-09-30 MED ORDER — BREZTRI AEROSPHERE 160-9-4.8 MCG/ACT IN AERO: 2.0000 | INHALATION_SPRAY | Freq: Two times a day (BID) | RESPIRATORY_TRACT | 3 refills | Status: AC

## 2023-09-30 NOTE — Telephone Encounter (Signed)
 Received fax from [facility] for medical clarification request.  Order to discontinue Bevespi  and initiate Breztri  2 puffs twice a day.  Will send in rx with information.

## 2023-10-02 ENCOUNTER — Other Ambulatory Visit: Payer: Self-pay | Admitting: Acute Care

## 2023-10-02 DIAGNOSIS — J45909 Unspecified asthma, uncomplicated: Secondary | ICD-10-CM

## 2023-10-08 ENCOUNTER — Other Ambulatory Visit: Payer: Self-pay | Admitting: Internal Medicine

## 2023-10-08 DIAGNOSIS — J45909 Unspecified asthma, uncomplicated: Secondary | ICD-10-CM

## 2023-11-26 ENCOUNTER — Ambulatory Visit (INDEPENDENT_AMBULATORY_CARE_PROVIDER_SITE_OTHER)

## 2023-11-26 DIAGNOSIS — R0609 Other forms of dyspnea: Secondary | ICD-10-CM

## 2023-11-26 LAB — PULMONARY FUNCTION TEST
DL/VA % pred: 98 %
DL/VA: 4.13 ml/min/mmHg/L
DLCO unc % pred: 104 %
DLCO unc: 19.39 ml/min/mmHg
FEF 25-75 Post: 0.92 L/s
FEF 25-75 Pre: 0.88 L/s
FEF2575-%Change-Post: 4 %
FEF2575-%Pred-Post: 48 %
FEF2575-%Pred-Pre: 46 %
FEV1-%Change-Post: 1 %
FEV1-%Pred-Post: 77 %
FEV1-%Pred-Pre: 76 %
FEV1-Post: 1.7 L
FEV1-Pre: 1.67 L
FEV1FVC-%Change-Post: 0 %
FEV1FVC-%Pred-Pre: 82 %
FEV6-%Change-Post: 2 %
FEV6-%Pred-Post: 97 %
FEV6-%Pred-Pre: 94 %
FEV6-Post: 2.69 L
FEV6-Pre: 2.61 L
FEV6FVC-%Change-Post: 1 %
FEV6FVC-%Pred-Post: 103 %
FEV6FVC-%Pred-Pre: 102 %
FVC-%Change-Post: 1 %
FVC-%Pred-Post: 94 %
FVC-%Pred-Pre: 92 %
FVC-Post: 2.72 L
FVC-Pre: 2.67 L
Post FEV1/FVC ratio: 63 %
Post FEV6/FVC ratio: 99 %
Pre FEV1/FVC ratio: 63 %
Pre FEV6/FVC Ratio: 98 %
RV % pred: 128 %
RV: 2.66 L
TLC % pred: 110 %
TLC: 5.35 L

## 2023-11-26 NOTE — Patient Instructions (Signed)
 Full pft performed today

## 2023-11-26 NOTE — Progress Notes (Signed)
 Full pft performed today

## 2023-11-27 NOTE — Progress Notes (Signed)
 HPI F former smoker followed for allergic rhinitis, asthma 01/22/11-99%, 99%, 97%, 456 meters Not limited by oxygenation -CY PFT 01/22/2011-mild obstructive airways disease a slight response to bronchodilator, air trapping, normal diffusion. FEV1 2.18/95%, FEV1/FVC 0.65, FEF 25-75% 1.19/44%. RV 120%, DLCO 91% Office Spirometry 12/27/14- --------------------------------------------------------------------------   12/20/22- 68 year-old female former smoker (45pkyrs) followed for allergic rhinitis, asthma/COPD, complicated by Osteoporosis -Neb albut, albuterol  hfa, Bevespi , theophylline  400, Discussed the use of AI scribe software for clinical note transcription with the patient, who gave verbal consent to proceed.  History of Present Illness   The patient, with a history of heavy smoking (three packs a day for about fifteen years), presents with a long-standing diagnosis of Asthma/COPD. She quit smoking a long time ago and has been managing her respiratory symptoms with inhalers. She was previously on Breztri , a triple therapy inhaler, but switched to Bevespi , a dual therapy inhaler without steroids, due to concerns about osteoporosis. She reports that it took a while to get used to the Bevespi , initially feeling that her lungs were not as deeply clear as with the Breztri . However, she reports that this issue resolved after continued use and she is currently doing okay on Bevespi , despite feeling that Breztri  was probably a little bit better.  The patient also has osteoporosis and underwent Reclast  treatment last year, which she reports caused side effects. She is due for a bone scan later this month to assess her bone health. She expresses uncertainty about whether being a year off of inhaled steroids will show a difference on the bone scan. This is being managed through her gynecologist.  Additionally, the patient had some palpitations last year. An EKG was performed by her primary care  physician, which was reported as normal. She does not report any progressive symptoms related to this.  The patient remains active, going to the gym, riding her bike, and even rode twenty miles at the beach on a bike cruiser. She also reports having had to have two knee shots for a torn lateral meniscus, which was not bad enough for surgery. She reports that her knee is now better, although recovery took a long time.   Assessment and Plan:    Asthma/COPD Stable on Bevespi  after switching from Breztri  due to concerns about osteoporosis. No current breathing troubles reported. -Trial of Stiolto as an alternative to Bevespi , to assess for any improvement in symptoms.  Osteoporosis Concerns about the impact of inhaled steroids on bone health. Reclast  administered last year with reported side effects. Bone scan scheduled later this month to assess current bone health. -Continue current management plan under gynecologist.  Palpitations No current symptoms reported. EKG performed by PCP was normal. -Continue current management plan under PCP.  General Health Maintenance -Continue current vaccination schedule.    11/28/23-  10 year-old female former smoker (45pkyrs) followed for Allergic Rhinitis, asthma/COPD, complicated by Osteoporosis -Neb albut, albuterol  hfa, Breztri , theophylline  400, LOV Parrett, NP 09/19/23- changed to Breztri , which she likes Feels dependent on theophyline- long time user. Sometimes hard to find at pharmacy. She asks us  to try different scirpts to see what she can find. She [facility] also [facility]. Bicycle for exercise. If she can't find theophylline , we discussed potential trial of Ohtuvayre , Increased IgE in past- might be candidate for Dupixent.  LAB-Allergy  _ elevated for CAT, Dog, Dust, Grass, Ragweed, Eos wnl,  CXR 09/19/23-  IMPRESSION: No active cardiopulmonary disease. Hyperinflation of the lungs PFT 11/26/23-Mild Obstructive Airways Disease  Insignificant  response to bronchodilatot Airtrapping without overinflation Normal Diffuion   ROS-see HPI + = positive Constitutional:   No-   weight loss, night sweats, fevers, chills, fatigue, lassitude. HEENT:   No-  headaches, difficulty swallowing, tooth/dental problems, sore throat,       No-  sneezing, itching, ear ache, nasal congestion, post nasal drip,  CV:  No-   chest pain, orthopnea, PND, swelling in lower extremities, anasarca, dizziness, +palpitations Resp: +shortness of breath with exertion .            No-productive cough,  No- non-productive cough,  No- coughing up of blood.              No-change in color of mucus.  No- wheezing.   Skin: No-   rash or lesions. GI:  No-   heartburn, indigestion, abdominal pain, nausea, vomiting,  GU:  MS:  +   joint pain or swelling.  Neuro-     nothing unusual Psych:  No- change in mood or affect. No depression or anxiety.  No memory loss.  OBJ General- Alert, Oriented, Affect-appropriate, Distress- none acute, trim Skin- rash-none, lesions- none, excoriation- none Lymphadenopathy- none Head- atraumatic            Eyes- Gross vision intact, PERRLA, conjunctivae clear secretions            Ears- Hearing, canals-normal            Nose- Clear, no-Septal dev, mucus, polyps, erosion, perforation             Throat- Mallampati II , mucosa clear , drainage- none, tonsils- atrophic Neck- flexible , trachea midline, no stridor , thyroid  nl, carotid no bruit Chest - symmetrical excursion , unlabored           Heart/CV- RRR , no murmur , no gallop  , no rub, nl s1 s2                           - JVD- none , edema- none, stasis changes- none, varices- none           Lung- Unlabored, clear, wheeze- none, cough- none , dullness-none, rub- none           Chest wall-  Abd-  Br/ Gen/ Rectal- Not done, not indicated Extrem- cyanosis- none, clubbing, none, atrophy- none, strength- nl Neuro- grossly intact to observation

## 2023-11-28 ENCOUNTER — Encounter: Payer: Self-pay | Admitting: Internal Medicine

## 2023-11-28 ENCOUNTER — Ambulatory Visit: Admitting: Internal Medicine

## 2023-11-28 VITALS — BP 122/74 | HR 47 | Temp 97.6°F | Ht 62.5 in | Wt 119.2 lb

## 2023-11-28 DIAGNOSIS — R002 Palpitations: Secondary | ICD-10-CM

## 2023-11-28 DIAGNOSIS — M810 Age-related osteoporosis without current pathological fracture: Secondary | ICD-10-CM | POA: Diagnosis not present

## 2023-11-28 DIAGNOSIS — J45909 Unspecified asthma, uncomplicated: Secondary | ICD-10-CM | POA: Diagnosis not present

## 2023-11-28 DIAGNOSIS — J4540 Moderate persistent asthma, uncomplicated: Secondary | ICD-10-CM

## 2023-11-28 MED ORDER — THEOPHYLLINE ER 200 MG PO TB12: ORAL_TABLET | ORAL | 12 refills | Status: AC

## 2023-11-28 MED ORDER — THEOPHYLLINE ER 300 MG PO TB12: ORAL_TABLET | ORAL | 4 refills | Status: AC

## 2023-11-28 MED ORDER — THEOPHYLLINE ER 300 MG PO TB12: 300.0000 mg | ORAL_TABLET | Freq: Every day | ORAL | 4 refills | Status: DC

## 2023-11-28 NOTE — Patient Instructions (Signed)
 Script(s) for theophylline   If we can't get theophylline  then we can look at Ohtuvayre   Try [facility]

## 2023-11-30 ENCOUNTER — Encounter: Payer: Self-pay | Admitting: Internal Medicine

## 2023-11-30 NOTE — Assessment & Plan Note (Signed)
 Continue Breztri  Multiple script optionss trying to continue theophylline  Otherwise, consider Ohtuvayre , Dupixent

## 2023-12-23 ENCOUNTER — Ambulatory Visit: Payer: Medicare HMO | Admitting: Internal Medicine

## 2023-12-24 ENCOUNTER — Ambulatory Visit: Admitting: Internal Medicine

## 2023-12-26 ENCOUNTER — Ambulatory Visit: Admitting: Internal Medicine

## 2023-12-26 ENCOUNTER — Encounter

## 2024-01-27 ENCOUNTER — Telehealth: Payer: Self-pay

## 2024-01-27 NOTE — Telephone Encounter (Signed)
 Copied from CRM [id number]. Topic: Referral - Request for Referral >> Jan 27, 2024  1:03 PM Mia F wrote: Did the patient discuss referral with their provider in the last year? No (If No - schedule appointment) (If Yes - send message)  Appointment offered? No  Type of order/referral and detailed reason for visit: eye exam- Insurance now requires referrals  Preference of office, provider, location: [facility] [id number]   If referral order, have you been seen by this specialty before? Yes- Once a year  (If Yes, this issue or another issue? When? Where?  Can we respond through MyChart? Yes

## 2024-01-28 NOTE — Telephone Encounter (Signed)
 Ok to pend referral I will co-sign

## 2024-01-29 ENCOUNTER — Other Ambulatory Visit: Payer: Self-pay

## 2024-01-29 DIAGNOSIS — Z0100 Encounter for examination of eyes and vision without abnormal findings: Secondary | ICD-10-CM

## 2024-01-31 NOTE — Telephone Encounter (Signed)
 Pt called back. She asked if someone could call her when referral is sent.

## 2024-01-31 NOTE — Telephone Encounter (Signed)
 I don't see it in the encounter

## 2024-04-20 ENCOUNTER — Encounter: Admitting: Internal Medicine

## 2024-04-20 ENCOUNTER — Ambulatory Visit
